# Patient Record
Sex: Female | Born: 1975
Health system: Southern US, Community
[De-identification: ages and names within clinical notes are randomized; demographics above are authoritative.]

## PROBLEM LIST (undated history)

## (undated) DIAGNOSIS — K589 Irritable bowel syndrome without diarrhea: Secondary | ICD-10-CM

## (undated) DIAGNOSIS — R51 Headache: Secondary | ICD-10-CM

## (undated) DIAGNOSIS — T7840XA Allergy, unspecified, initial encounter: Secondary | ICD-10-CM

## (undated) DIAGNOSIS — K2270 Barrett's esophagus without dysplasia: Secondary | ICD-10-CM

## (undated) DIAGNOSIS — E669 Obesity, unspecified: Secondary | ICD-10-CM

## (undated) DIAGNOSIS — F32A Depression, unspecified: Secondary | ICD-10-CM

## (undated) DIAGNOSIS — G8929 Other chronic pain: Secondary | ICD-10-CM

## (undated) DIAGNOSIS — K219 Gastro-esophageal reflux disease without esophagitis: Secondary | ICD-10-CM

## (undated) DIAGNOSIS — F419 Anxiety disorder, unspecified: Secondary | ICD-10-CM

## (undated) DIAGNOSIS — R519 Headache, unspecified: Secondary | ICD-10-CM

## (undated) DIAGNOSIS — F329 Major depressive disorder, single episode, unspecified: Secondary | ICD-10-CM

## (undated) DIAGNOSIS — M1990 Unspecified osteoarthritis, unspecified site: Secondary | ICD-10-CM

## (undated) DIAGNOSIS — I10 Essential (primary) hypertension: Secondary | ICD-10-CM

## (undated) HISTORY — DX: Anxiety disorder, unspecified: F41.9

## (undated) HISTORY — DX: Unspecified osteoarthritis, unspecified site: M19.90

## (undated) HISTORY — PX: COLONOSCOPY: SHX174

## (undated) HISTORY — DX: Gastro-esophageal reflux disease without esophagitis: K21.9

## (undated) HISTORY — DX: Irritable bowel syndrome, unspecified: K58.9

## (undated) HISTORY — DX: Major depressive disorder, single episode, unspecified: F32.9

## (undated) HISTORY — DX: Obesity, unspecified: E66.9

## (undated) HISTORY — DX: Headache, unspecified: R51.9

## (undated) HISTORY — DX: Depression, unspecified: F32.A

## (undated) HISTORY — DX: Other chronic pain: G89.29

## (undated) HISTORY — PX: REDUCTION MAMMAPLASTY: SUR839

## (undated) HISTORY — PX: OTHER SURGICAL HISTORY: SHX169

## (undated) HISTORY — DX: Headache: R51

## (undated) HISTORY — PX: CHOLECYSTECTOMY: SHX55

## (undated) HISTORY — DX: Allergy, unspecified, initial encounter: T78.40XA

## (undated) HISTORY — DX: Essential (primary) hypertension: I10

## (undated) HISTORY — DX: Barrett's esophagus without dysplasia: K22.70

---

## 2000-07-27 DIAGNOSIS — J3089 Other allergic rhinitis: Secondary | ICD-10-CM | POA: Insufficient documentation

## 2002-02-15 ENCOUNTER — Emergency Department (HOSPITAL_COMMUNITY): Admission: EM | Admit: 2002-02-15 | Discharge: 2002-02-15 | Payer: Self-pay | Admitting: Emergency Medicine

## 2006-08-31 ENCOUNTER — Encounter: Admission: RE | Admit: 2006-08-31 | Discharge: 2006-08-31 | Payer: Self-pay | Admitting: Unknown Physician Specialty

## 2008-03-13 DIAGNOSIS — F32A Depression, unspecified: Secondary | ICD-10-CM | POA: Insufficient documentation

## 2010-06-05 DIAGNOSIS — Z9049 Acquired absence of other specified parts of digestive tract: Secondary | ICD-10-CM | POA: Insufficient documentation

## 2010-06-10 DIAGNOSIS — R519 Headache, unspecified: Secondary | ICD-10-CM | POA: Insufficient documentation

## 2010-06-10 DIAGNOSIS — G4700 Insomnia, unspecified: Secondary | ICD-10-CM | POA: Insufficient documentation

## 2010-06-10 DIAGNOSIS — M2550 Pain in unspecified joint: Secondary | ICD-10-CM | POA: Insufficient documentation

## 2012-06-28 DIAGNOSIS — K589 Irritable bowel syndrome without diarrhea: Secondary | ICD-10-CM | POA: Insufficient documentation

## 2012-11-19 DIAGNOSIS — I10 Essential (primary) hypertension: Secondary | ICD-10-CM | POA: Insufficient documentation

## 2013-12-23 ENCOUNTER — Encounter: Payer: Self-pay | Admitting: Gastroenterology

## 2014-02-17 ENCOUNTER — Ambulatory Visit (INDEPENDENT_AMBULATORY_CARE_PROVIDER_SITE_OTHER): Payer: BC Managed Care – PPO | Admitting: Gastroenterology

## 2014-02-17 ENCOUNTER — Encounter: Payer: Self-pay | Admitting: Gastroenterology

## 2014-02-17 ENCOUNTER — Other Ambulatory Visit (INDEPENDENT_AMBULATORY_CARE_PROVIDER_SITE_OTHER): Payer: BC Managed Care – PPO

## 2014-02-17 VITALS — BP 124/74 | HR 64 | Ht 68.0 in | Wt 228.2 lb

## 2014-02-17 DIAGNOSIS — R079 Chest pain, unspecified: Secondary | ICD-10-CM

## 2014-02-17 DIAGNOSIS — R198 Other specified symptoms and signs involving the digestive system and abdomen: Secondary | ICD-10-CM

## 2014-02-17 DIAGNOSIS — K219 Gastro-esophageal reflux disease without esophagitis: Secondary | ICD-10-CM

## 2014-02-17 DIAGNOSIS — R109 Unspecified abdominal pain: Secondary | ICD-10-CM

## 2014-02-17 LAB — CBC WITH DIFFERENTIAL/PLATELET
Basophils Absolute: 0 10*3/uL (ref 0.0–0.1)
Basophils Relative: 0.6 % (ref 0.0–3.0)
EOS ABS: 0.5 10*3/uL (ref 0.0–0.7)
EOS PCT: 7.3 % — AB (ref 0.0–5.0)
HEMATOCRIT: 40.2 % (ref 36.0–46.0)
Hemoglobin: 13.6 g/dL (ref 12.0–15.0)
LYMPHS ABS: 1.7 10*3/uL (ref 0.7–4.0)
Lymphocytes Relative: 26.7 % (ref 12.0–46.0)
MCHC: 33.7 g/dL (ref 30.0–36.0)
MCV: 88.7 fl (ref 78.0–100.0)
MONO ABS: 0.4 10*3/uL (ref 0.1–1.0)
Monocytes Relative: 6.7 % (ref 3.0–12.0)
Neutro Abs: 3.7 10*3/uL (ref 1.4–7.7)
Neutrophils Relative %: 58.7 % (ref 43.0–77.0)
PLATELETS: 167 10*3/uL (ref 150.0–400.0)
RBC: 4.53 Mil/uL (ref 3.87–5.11)
RDW: 13.2 % (ref 11.5–15.5)
WBC: 6.4 10*3/uL (ref 4.0–10.5)

## 2014-02-17 LAB — HEPATIC FUNCTION PANEL
ALK PHOS: 41 U/L (ref 39–117)
ALT: 12 U/L (ref 0–35)
AST: 15 U/L (ref 0–37)
Albumin: 4 g/dL (ref 3.5–5.2)
BILIRUBIN DIRECT: 0.1 mg/dL (ref 0.0–0.3)
BILIRUBIN TOTAL: 0.8 mg/dL (ref 0.2–1.2)
Total Protein: 7 g/dL (ref 6.0–8.3)

## 2014-02-17 LAB — BASIC METABOLIC PANEL
BUN: 14 mg/dL (ref 6–23)
CHLORIDE: 106 meq/L (ref 96–112)
CO2: 27 meq/L (ref 19–32)
CREATININE: 0.9 mg/dL (ref 0.4–1.2)
Calcium: 9.4 mg/dL (ref 8.4–10.5)
GFR: 72.87 mL/min (ref >60.00)
GLUCOSE: 86 mg/dL (ref 70–99)
POTASSIUM: 4.3 meq/L (ref 3.5–5.1)
Sodium: 139 mEq/L (ref 135–145)

## 2014-02-17 LAB — TSH: TSH: 1.16 u[IU]/mL (ref 0.35–4.50)

## 2014-02-17 MED ORDER — DEXLANSOPRAZOLE 60 MG PO CPDR: 60.0000 mg | DELAYED_RELEASE_CAPSULE | Freq: Every day | ORAL | Status: DC

## 2014-02-17 MED ORDER — GLYCOPYRROLATE 1 MG PO TABS: 1.0000 mg | ORAL_TABLET | Freq: Two times a day (BID) | ORAL | Status: DC

## 2014-02-17 NOTE — Patient Instructions (Signed)
Your physician has requested that you go to the basement for the following lab work before leaving today: [employer].  You have been scheduled for an endoscopy with propofol. Please follow written instructions given to you at your visit today. If you use inhalers (even only as needed), please bring them with you on the day of your procedure. Your physician has requested that you go to www.startemmi.com and enter the access code given to you at your visit today. This web site gives a general overview about your procedure. However, you should still follow specific instructions given to you by our office regarding your preparation for the procedure.  We have sent the following medications to your pharmacy for you to pick up at your convenience: Robinul.  Hold Protonix and start Dexilant samples one tablet my mouth daily.   Patient advised to avoid spicy, acidic, citrus, chocolate, mints, fruit and fruit juices.  Limit the intake of caffeine, alcohol and Soda.  Don't exercise too soon after eating.  Don't lie down within 3-4 hours of eating.  Elevate the head of your bed.  Start over the counter Miralax mixing 17 grams in 8 oz of water daily.   Follow the instructions on the Hemoccult cards and mail them back to [place] when you are finished or you may take them directly to the lab in the basement of the [place] building. We will call you with the results.   Thank you for choosing me and [facility].  Venita Lick. Pleas Koch., MD., Clementeen Graham  cc: Kathlee Nations, MD

## 2014-02-17 NOTE — Progress Notes (Signed)
    History of Present Illness: This is a 38 year old female who relates a 15 year history of GERD. She started taking pantoprazole 40 mg daily about 2 years ago with good control of her reflux symptoms. In addition she has a history of irritable bowel syndrome with alternating diarrhea and constipation. She relates episodes of lower abdominal crampy pain. She states dicyclomine was tried with no impact on symptoms. She takes MiraLax occasionally when constipated but this often leads to diarrhea phase. She relates episodes of severe pressure-like chest pain for the past few months pantoprazole was increased to 40 mg twice daily along with ranitidine 150 mg twice daily with no apparent impact on her chest pain or lower abdominal pain. Denies weight loss, change in stool caliber, melena, hematochezia, nausea, vomiting, dysphagia.  Review of Systems: Pertinent positive and negative review of systems were noted in the above HPI section. All other review of systems were otherwise negative.  Current Medications, Allergies, Past Medical History, Past Surgical History, Family History and Social History were reviewed in [employer] record.  Physical Exam: General: Well developed , well nourished, no acute distress Head: Normocephalic and atraumatic Eyes:  sclerae anicteric, EOMI Ears: Normal auditory acuity Mouth: No deformity or lesions Neck: Supple, no masses or thyromegaly Lungs: Clear throughout to auscultation Heart: Regular rate and rhythm; no murmurs, rubs or bruits Abdomen: Soft, non tender and non distended. No masses, hepatosplenomegaly or hernias noted. Normal Bowel sounds Musculoskeletal: Symmetrical with no gross deformities  Skin: No lesions on visible extremities Pulses:  Normal pulses noted Extremities: No clubbing, cyanosis, edema or deformities noted Neurological: Alert oriented x 4, grossly nonfocal Cervical Nodes:  No significant cervical adenopathy Inguinal  Nodes: No significant inguinal adenopathy Psychological:  Alert and cooperative. Normal mood and affect  Assessment and Recommendations:  1. Presumed IBS. Glycopyrrolate 1 mg bid. Miralax once daily or qod to prevent constipation and to maintain regular daily bowel movements. Blood work. Stool hemoccults.  2. Chest pain, intermittent. This symptom does not appear to be typical for GERD and has not responded to acid suppression. Await LFTs however her symptoms do not appear typical for choledocholithiasis. Further evaluation with EGD as below. Her primary physician will need to evaluate for other causes  3. GERD. Dexilant 60 mg po daily. The risks, benefits, and alternatives to endoscopy with possible biopsy and possible dilation were discussed with the patient and they consent to proceed.

## 2014-02-18 ENCOUNTER — Other Ambulatory Visit: Payer: Self-pay

## 2014-02-18 DIAGNOSIS — D649 Anemia, unspecified: Secondary | ICD-10-CM

## 2014-02-24 ENCOUNTER — Encounter: Payer: Self-pay | Admitting: Gastroenterology

## 2014-02-24 ENCOUNTER — Ambulatory Visit (AMBULATORY_SURGERY_CENTER): Payer: BC Managed Care – PPO | Admitting: Gastroenterology

## 2014-02-24 VITALS — BP 120/87 | HR 53 | Temp 97.1°F | Resp 18 | Ht 68.0 in | Wt 228.0 lb

## 2014-02-24 DIAGNOSIS — K219 Gastro-esophageal reflux disease without esophagitis: Secondary | ICD-10-CM

## 2014-02-24 DIAGNOSIS — R079 Chest pain, unspecified: Secondary | ICD-10-CM

## 2014-02-24 HISTORY — PX: UPPER GASTROINTESTINAL ENDOSCOPY: SHX188

## 2014-02-24 MED ORDER — DEXLANSOPRAZOLE 60 MG PO CPDR: 60.0000 mg | DELAYED_RELEASE_CAPSULE | Freq: Every day | ORAL | Status: DC

## 2014-02-24 MED ORDER — SODIUM CHLORIDE 0.9 % IV SOLN: 500.0000 mL | INTRAVENOUS | Status: DC

## 2014-02-24 NOTE — Progress Notes (Signed)
A/ox3, pleased with MAC, report to RN 

## 2014-02-24 NOTE — Patient Instructions (Signed)
YOU HAD AN ENDOSCOPIC PROCEDURE TODAY AT THE [facility]: Refer to the procedure report that was given to you for any specific questions about what was found during the examination.  If the procedure report does not answer your questions, please call your gastroenterologist to clarify.  If you requested that your care partner not be given the details of your procedure findings, then the procedure report has been included in a sealed envelope for you to review at your convenience later.  YOU SHOULD EXPECT: Some feelings of bloating in the abdomen. Passage of more gas than usual.  Walking can help get rid of the air that was put into your GI tract during the procedure and reduce the bloating. If you had a lower endoscopy (such as a colonoscopy or flexible sigmoidoscopy) you may notice spotting of blood in your stool or on the toilet paper. If you underwent a bowel prep for your procedure, then you may not have a normal bowel movement for a few days.  DIET: Your first meal following the procedure should be a light meal and then it is ok to progress to your normal diet.  A half-sandwich or bowl of soup is an example of a good first meal.  Heavy or fried foods are harder to digest and may make you feel nauseous or bloated.  Likewise meals heavy in dairy and vegetables can cause extra gas to form and this can also increase the bloating.  Drink plenty of fluids but you should avoid alcoholic beverages for 24 hours.  ACTIVITY: Your care partner should take you home directly after the procedure.  You should plan to take it easy, moving slowly for the rest of the day.  You can resume normal activity the day after the procedure however you should NOT DRIVE or use heavy machinery for 24 hours (because of the sedation medicines used during the test).    SYMPTOMS TO REPORT IMMEDIATELY: A gastroenterologist can be reached at any hour.  During normal business hours, 8:30 AM to 5:00 PM Monday through Friday,  call [phone].  After hours and on weekends, please call the GI answering service at [phone] who will take a message and have the physician on call contact you.    Following upper endoscopy (EGD)  Vomiting of blood or coffee ground material  New chest pain or pain under the shoulder blades  Painful or persistently difficult swallowing  New shortness of breath  Fever of 100F or higher  Black, tarry-looking stools  FOLLOW UP: If any biopsies were taken you will be contacted by phone or by letter within the next 1-3 weeks.  Call your gastroenterologist if you have not heard about the biopsies in 3 weeks.  Our staff will call the home number listed on your records the next business day following your procedure to check on you and address any questions or concerns that you may have at that time regarding the information given to you following your procedure. This is a courtesy call and so if there is no answer at the home number and we have not heard from you through the emergency physician on call, we will assume that you have returned to your regular daily activities without incident.  SIGNATURES/CONFIDENTIALITY: You and/or your care partner have signed paperwork which will be entered into your electronic medical record.  These signatures attest to the fact that that the information above on your After Visit Summary has been reviewed and is understood.  Full   responsibility of the confidentiality of this discharge information lies with you and/or your care-partner.  Resume medications. Information given on Barrett's and hiatal hernia with discharge instructions.

## 2014-02-24 NOTE — Op Note (Signed)
[facility] [address] [place], [address]   ENDOSCOPY PROCEDURE REPORT  PATIENT: Sara Chambers, Sara Chambers  MR#: [id number] BIRTHDATE: [birth year] , 37  yrs. old GENDER: Female ENDOSCOPIST: Meryl Dare, MD, Clementeen Graham REFERRED BY:  Kathlee Nations, M.D. PROCEDURE DATE:  02/24/2014 PROCEDURE:  EGD w/ biopsy ASA CLASS:     Class II INDICATIONS:  History of esophageal reflux.   Chest pain. MEDICATIONS: MAC sedation, administered by CRNA and propofol (Diprivan) 300mg  IV TOPICAL ANESTHETIC: none DESCRIPTION OF PROCEDURE: After the risks benefits and alternatives of the procedure were thoroughly explained, informed consent was obtained.  The LB [id number] endoscope was introduced through the mouth and advanced to the second portion of the duodenum. Without limitations.  The instrument was slowly withdrawn as the mucosa was fully examined.  ESOPHAGUS: There was evidence of suspected Barrett's esophagus in the lower third of the esophagus and middle third of the esophagus from 29-35 cm from the incisors.  Multiple random biopsies were performed.   The esophagus was otherwise normal. STOMACH: The mucosa and folds of the stomach appeared normal. DUODENUM: The duodenal mucosa showed no abnormalities in the bulb and second portion of the duodenum.  Retroflexed views revealed a 5 cm hiatal hernia.  The scope was then withdrawn from the patient and the procedure completed.  COMPLICATIONS: There were no complications.  ENDOSCOPIC IMPRESSION: 1.   Suspected Barrett's esophagus; multiple random biopsies 2.   5 cm hiatal hernia  RECOMMENDATIONS: 1.  Anti-reflux regimen long term 2.  Await pathology results 3.  Continue Dexliant 60 qam and ranitidine 300 mg hs 4.  Although she has GERD I do not feel that GERD explains all her chest symptoms  eSigned:  Meryl Dare, MD, [facility] 02/24/2014 2:30 PM

## 2014-02-25 ENCOUNTER — Telehealth: Payer: Self-pay | Admitting: *Deleted

## 2014-02-25 NOTE — Telephone Encounter (Signed)
  Follow up Call-  Call back number 02/24/2014  Post procedure Call Back phone  # [phone]  Permission to leave phone message Yes     Patient questions:  Do you have a fever, pain , or abdominal swelling? no Pain Score  0 *  Have you tolerated food without any problems? yes  Have you been able to return to your normal activities? yes  Do you have any questions about your discharge instructions: Diet   no Medications  no Follow up visit  no  Do you have questions or concerns about your Care? no  Actions: * If pain score is 4 or above: No action needed, pain <4.

## 2014-02-27 ENCOUNTER — Other Ambulatory Visit (INDEPENDENT_AMBULATORY_CARE_PROVIDER_SITE_OTHER): Payer: BC Managed Care – PPO

## 2014-02-27 DIAGNOSIS — R198 Other specified symptoms and signs involving the digestive system and abdomen: Secondary | ICD-10-CM

## 2014-02-27 DIAGNOSIS — K2270 Barrett's esophagus without dysplasia: Secondary | ICD-10-CM

## 2014-02-27 LAB — HEMOCCULT SLIDES (X 3 CARDS)
Fecal Occult Blood: NEGATIVE
OCCULT 1: POSITIVE — AB
OCCULT 2: NEGATIVE
OCCULT 3: NEGATIVE
OCCULT 4: NEGATIVE
OCCULT 5: NEGATIVE

## 2014-03-03 ENCOUNTER — Encounter: Payer: Self-pay | Admitting: Gastroenterology

## 2014-03-03 ENCOUNTER — Telehealth: Payer: Self-pay | Admitting: Gastroenterology

## 2014-03-03 DIAGNOSIS — R195 Other fecal abnormalities: Secondary | ICD-10-CM

## 2014-03-03 NOTE — Telephone Encounter (Signed)
She can repeat but even if negative I would still recommend colonoscopy since they were positive. If there are negative on repeat and she wants to delay colonoscopy I understand.

## 2014-03-03 NOTE — Telephone Encounter (Signed)
Left message for patient to call back  

## 2014-03-03 NOTE — Telephone Encounter (Signed)
Dr. Russella Dar patient would like to repeat hemoccult cards prior to scheduling a colonoscopy.  See result note on hemoccult cards from 02/27/14.  Is this ok?

## 2014-03-03 NOTE — Telephone Encounter (Signed)
Patient notified of Dr. Ardell Isaacs recommendations.  I have mailed her hemoccult cards.  She wants to think about it and call back

## 2014-03-09 DIAGNOSIS — K2270 Barrett's esophagus without dysplasia: Secondary | ICD-10-CM | POA: Insufficient documentation

## 2014-04-14 ENCOUNTER — Encounter: Payer: Self-pay | Admitting: Gastroenterology

## 2014-05-29 ENCOUNTER — Encounter: Payer: BC Managed Care – PPO | Admitting: Gastroenterology

## 2014-06-29 ENCOUNTER — Other Ambulatory Visit: Payer: Self-pay

## 2014-06-29 DIAGNOSIS — Z1231 Encounter for screening mammogram for malignant neoplasm of breast: Secondary | ICD-10-CM

## 2014-06-29 DIAGNOSIS — Z9889 Other specified postprocedural states: Secondary | ICD-10-CM

## 2014-07-07 ENCOUNTER — Encounter (INDEPENDENT_AMBULATORY_CARE_PROVIDER_SITE_OTHER): Payer: Self-pay

## 2014-07-07 ENCOUNTER — Ambulatory Visit
Admission: RE | Admit: 2014-07-07 | Discharge: 2014-07-07 | Disposition: A | Discharge status: Home / Self Care | Payer: BC Managed Care – PPO | Source: Ambulatory Visit

## 2014-07-07 DIAGNOSIS — Z9889 Other specified postprocedural states: Secondary | ICD-10-CM

## 2014-07-07 DIAGNOSIS — Z1231 Encounter for screening mammogram for malignant neoplasm of breast: Secondary | ICD-10-CM

## 2014-07-10 ENCOUNTER — Other Ambulatory Visit: Payer: Self-pay | Admitting: Internal Medicine

## 2014-07-10 DIAGNOSIS — R928 Other abnormal and inconclusive findings on diagnostic imaging of breast: Secondary | ICD-10-CM

## 2014-07-23 ENCOUNTER — Other Ambulatory Visit: Payer: Self-pay | Admitting: Internal Medicine

## 2014-07-23 ENCOUNTER — Ambulatory Visit
Admission: RE | Admit: 2014-07-23 | Discharge: 2014-07-23 | Disposition: A | Discharge status: Home / Self Care | Payer: BC Managed Care – PPO | Source: Ambulatory Visit | Attending: Internal Medicine | Admitting: Internal Medicine

## 2014-07-23 DIAGNOSIS — R928 Other abnormal and inconclusive findings on diagnostic imaging of breast: Secondary | ICD-10-CM

## 2014-08-07 ENCOUNTER — Ambulatory Visit
Admission: RE | Admit: 2014-08-07 | Discharge: 2014-08-07 | Disposition: A | Discharge status: Home / Self Care | Payer: BC Managed Care – PPO | Source: Ambulatory Visit | Attending: Internal Medicine | Admitting: Internal Medicine

## 2014-08-07 DIAGNOSIS — R928 Other abnormal and inconclusive findings on diagnostic imaging of breast: Secondary | ICD-10-CM

## 2014-09-01 DIAGNOSIS — M1710 Unilateral primary osteoarthritis, unspecified knee: Secondary | ICD-10-CM | POA: Insufficient documentation

## 2014-09-01 DIAGNOSIS — M170 Bilateral primary osteoarthritis of knee: Secondary | ICD-10-CM | POA: Insufficient documentation

## 2014-09-01 DIAGNOSIS — M179 Osteoarthritis of knee, unspecified: Secondary | ICD-10-CM | POA: Insufficient documentation

## 2014-09-07 ENCOUNTER — Other Ambulatory Visit: Payer: Self-pay | Admitting: Sports Medicine

## 2014-09-07 DIAGNOSIS — T148XXA Other injury of unspecified body region, initial encounter: Secondary | ICD-10-CM

## 2014-09-09 ENCOUNTER — Ambulatory Visit
Admission: RE | Admit: 2014-09-09 | Discharge: 2014-09-09 | Disposition: A | Discharge status: Home / Self Care | Payer: BC Managed Care – PPO | Source: Ambulatory Visit | Attending: Sports Medicine | Admitting: Sports Medicine

## 2014-09-09 DIAGNOSIS — T148XXA Other injury of unspecified body region, initial encounter: Secondary | ICD-10-CM

## 2015-01-01 ENCOUNTER — Other Ambulatory Visit: Payer: Self-pay | Admitting: Internal Medicine

## 2015-01-01 ENCOUNTER — Other Ambulatory Visit: Payer: Self-pay

## 2015-01-01 DIAGNOSIS — N6489 Other specified disorders of breast: Secondary | ICD-10-CM

## 2015-02-09 ENCOUNTER — Ambulatory Visit
Admission: RE | Admit: 2015-02-09 | Discharge: 2015-02-09 | Disposition: A | Discharge status: Home / Self Care | Payer: BLUE CROSS/BLUE SHIELD | Source: Ambulatory Visit

## 2015-02-09 ENCOUNTER — Encounter (INDEPENDENT_AMBULATORY_CARE_PROVIDER_SITE_OTHER): Payer: Self-pay

## 2015-02-09 DIAGNOSIS — N6489 Other specified disorders of breast: Secondary | ICD-10-CM

## 2015-03-04 ENCOUNTER — Telehealth: Payer: Self-pay | Admitting: Gastroenterology

## 2015-03-04 NOTE — Telephone Encounter (Signed)
Yes ok for direct colonoscopy in [facility] 

## 2015-03-04 NOTE — Telephone Encounter (Signed)
Patient had a hemoccult card last year that was 1 out of 6 positive.  She declined to schedule the colonoscopy you recommended last year.  She is now calling and wants to schedule.  Is this ok to do direct?

## 2015-03-04 NOTE — Telephone Encounter (Signed)
Left message for patient to call back  

## 2015-03-04 NOTE — Telephone Encounter (Signed)
Patient left me a voicemail and I tried to return call again on both numbers at her request.   Left message for patient to call back

## 2015-03-05 NOTE — Telephone Encounter (Signed)
Patient is scheduled for colon and pre-visit

## 2015-04-19 ENCOUNTER — Ambulatory Visit (AMBULATORY_SURGERY_CENTER): Payer: Self-pay

## 2015-04-19 VITALS — Ht 68.0 in | Wt 229.0 lb

## 2015-04-19 DIAGNOSIS — R195 Other fecal abnormalities: Secondary | ICD-10-CM

## 2015-04-19 MED ORDER — NA SULFATE-K SULFATE-MG SULF 17.5-3.13-1.6 GM/177ML PO SOLN: 1.0000 | Freq: Once | ORAL | Status: DC

## 2015-04-19 NOTE — Progress Notes (Signed)
No anesthesia complications Not on home 02 No egg or soy allergies Pt doesn't take diet drugs Emmi video sent to [email or web address]

## 2015-04-27 ENCOUNTER — Encounter: Payer: BLUE CROSS/BLUE SHIELD | Admitting: Gastroenterology

## 2015-04-28 ENCOUNTER — Ambulatory Visit (HOSPITAL_COMMUNITY)
Admission: RE | Admit: 2015-04-28 | Discharge: 2015-04-28 | Disposition: A | Discharge status: Home / Self Care | Payer: BLUE CROSS/BLUE SHIELD | Source: Ambulatory Visit | Attending: Internal Medicine | Admitting: Internal Medicine

## 2015-04-28 ENCOUNTER — Telehealth: Payer: Self-pay | Admitting: Gastroenterology

## 2015-04-28 ENCOUNTER — Other Ambulatory Visit (HOSPITAL_COMMUNITY): Payer: Self-pay | Admitting: *Deleted

## 2015-04-28 DIAGNOSIS — R1011 Right upper quadrant pain: Secondary | ICD-10-CM

## 2015-04-28 DIAGNOSIS — Z9049 Acquired absence of other specified parts of digestive tract: Secondary | ICD-10-CM | POA: Insufficient documentation

## 2015-04-28 DIAGNOSIS — R1084 Generalized abdominal pain: Secondary | ICD-10-CM | POA: Insufficient documentation

## 2015-04-28 DIAGNOSIS — R112 Nausea with vomiting, unspecified: Secondary | ICD-10-CM | POA: Insufficient documentation

## 2015-04-28 MED ORDER — IOHEXOL 300 MG/ML  SOLN
100.0000 mL | Freq: Once | INTRAMUSCULAR | Status: AC | PRN
  Administered 2015-04-28: 100 mL via INTRAVENOUS

## 2015-04-28 NOTE — Telephone Encounter (Signed)
Patient is on the way to the ER in [place]

## 2015-04-29 ENCOUNTER — Ambulatory Visit (INDEPENDENT_AMBULATORY_CARE_PROVIDER_SITE_OTHER): Payer: BLUE CROSS/BLUE SHIELD | Admitting: Nurse Practitioner

## 2015-04-29 ENCOUNTER — Telehealth: Payer: Self-pay | Admitting: Gastroenterology

## 2015-04-29 ENCOUNTER — Encounter: Payer: Self-pay | Admitting: Nurse Practitioner

## 2015-04-29 VITALS — BP 96/60 | HR 76 | Ht 66.54 in | Wt 223.5 lb

## 2015-04-29 DIAGNOSIS — R1010 Upper abdominal pain, unspecified: Secondary | ICD-10-CM

## 2015-04-29 DIAGNOSIS — R1012 Left upper quadrant pain: Secondary | ICD-10-CM

## 2015-04-29 DIAGNOSIS — R9389 Abnormal findings on diagnostic imaging of other specified body structures: Secondary | ICD-10-CM | POA: Insufficient documentation

## 2015-04-29 DIAGNOSIS — R7989 Other specified abnormal findings of blood chemistry: Secondary | ICD-10-CM | POA: Diagnosis not present

## 2015-04-29 DIAGNOSIS — R938 Abnormal findings on diagnostic imaging of other specified body structures: Secondary | ICD-10-CM

## 2015-04-29 DIAGNOSIS — R933 Abnormal findings on diagnostic imaging of other parts of digestive tract: Secondary | ICD-10-CM

## 2015-04-29 DIAGNOSIS — G8929 Other chronic pain: Secondary | ICD-10-CM

## 2015-04-29 DIAGNOSIS — R945 Abnormal results of liver function studies: Principal | ICD-10-CM

## 2015-04-29 DIAGNOSIS — R1011 Right upper quadrant pain: Secondary | ICD-10-CM

## 2015-04-29 NOTE — Progress Notes (Signed)
History of Present Illness:  Patient is a 39 year old female known to Dr. Fuller Plan. She is actually scheduled for colonoscopy in September for evaluation of focal blood in stool.  Patient comes in today for evaluation of abdominal pain and abnormal CT scan. On Sunday, 5 days ago patient developed acute vomiting, upper abdominal pain and diarrhea. She took several times for the upper abdominal pain which felt like an acid reflux attack. She is on a PPI and an H2 blocker. The pain did not radiate through to the back but it did not get any better so patient saw PCP.  Alkaline phosphatase elevated at 132, AST 197, ALT 293, total bilirubin was normal. White count was normal. Patient went to the [facility] ED yesterday for persistent symptoms. Her LFTs had come down. Alk phosphatase was 116, AST 58, ALT 183. Bilirubin still normal. Her lipase was normal. White count was normal. Abdominal ultrasound showed a mildly dilated common bile duct. No mass or stones in the duct. CT scan with contrast was negative for ductal dilation however. No acute findings on the scan though the tip of the appendix was mildly dilated. No surrounding inflammatory changes but multiple borderline enlarged ileocecal lymph nodes were seen.  Patient is still nauseated, the diarrhea has resolved. Her upper abdominal pain has significantly in improved but now she is having discomfort in her right lower quadrant unrelated to eating.  Current Medications, Allergies, Past Medical History, Past Surgical History, Family History and Social History were reviewed in [employer] record.  Studies:   Ct Abdomen Pelvis W Contrast  04/28/2015   CLINICAL DATA:  39 year old female with generalized abdominal pain since Sunday. Nausea and vomiting.  EXAM: CT ABDOMEN AND PELVIS WITH CONTRAST  TECHNIQUE: Multidetector CT imaging of the abdomen and pelvis was performed using the standard protocol following bolus administration of  intravenous contrast.  CONTRAST:  159m OMNIPAQUE IOHEXOL 300 MG/ML  SOLN  COMPARISON:  No priors.  FINDINGS: Lower chest:  Unremarkable.  Hepatobiliary: No cystic or solid hepatic lesions. No intra or extrahepatic biliary ductal dilatation. Status post cholecystectomy.  Pancreas: No pancreatic mass. No pancreatic ductal dilatation. No pancreatic or peripancreatic fluid or inflammatory changes.  Spleen: Unremarkable.  Adrenals/Urinary Tract: Normal appearance of the kidneys and bilateral adrenal glands. No hydroureteronephrosis. Urinary bladder is normal in appearance.  Stomach/Bowel: The appearance of the stomach is normal. No pathologic dilatation of small bowel or colon. Although the proximal aspect of the appendix is normal in size in appearance, there is an appendicolith in the distal aspect of the appendix, and the tip of the appendix appears mildly dilated measuring up to 8 mm. No periappendiceal inflammatory changes are noted at this time.  Vascular/Lymphatic: No significant atherosclerotic disease, aneurysm or dissection identified in the abdominal or pelvic vasculature. Retroaortic left renal vein (normal anatomical variant) incidentally noted. Several borderline enlarged ileocecal lymph nodes are noted, measuring up to 9 mm in short axis. No other lymphadenopathy is noted elsewhere in the abdomen or pelvis.  Reproductive: Fallopian tube occluder devices are noted. Uterus and ovaries are otherwise unremarkable in appearance.  Other: No significant volume of ascites.  No pneumoperitoneum.  Musculoskeletal: There are no aggressive appearing lytic or blastic lesions noted in the visualized portions of the skeleton.  IMPRESSION: 1. While there are no acute findings in the abdomen or pelvis to account for the patient's symptoms, there is an appendicolith in the distal appendix, and the tip of the appendix is mildly dilated.  Although there are no surrounding inflammatory changes to suggest an acute appendicitis  at this time, these features can suggest chronic appendicitis, particularly with the multiple borderline enlarged ileocecal lymph nodes. Clinical correlation is recommended. 2. No other acute findings to account for the patient's symptoms. 3. Status post cholecystectomy. 4. Additional incidental findings, as above. These results will be called to the ordering clinician or representative by the Radiologist Assistant, and communication documented in the PACS or zVision Dashboard.   Electronically Signed   By: Vinnie Langton M.D.   On: 04/28/2015 17:59     Physical Exam: General: Pleasant, well developed , white female in no acute distress Head: Normocephalic and atraumatic Eyes:  sclerae anicteric, conjunctiva pink  Ears: Normal auditory acuity Lungs: Clear throughout to auscultation Heart: Regular rate and rhythm Abdomen: Soft, non distended, mild RLQ tenderness.  No masses, no hepatomegaly. Normal bowel sounds Musculoskeletal: Symmetrical with no gross deformities  Extremities: No edema  Neurological: Alert oriented x 4, grossly nonfocal Psychological:  Alert and cooperative. Normal mood and affect  Assessment and Recommendations:  Pleasant 39 year old female with recent episode of nausea, vomiting, diarrhea and upper abdominal pain. Symptoms associated with some LFT abnormalities, mainly transaminitis. Mild biliary duct dilation on ultrasound but not on CT scan. Patient is status post remote cholecystectomy for gallstones. Diarrhea has resolved. Patient still with some residual upper abdominal discomfort but pain has now moved down into her RLQ.  Overall picture confusing with both upper and lower abdominal symptoms. She may have passed a gallstone based on symptoms, abnormal LFTs, biliary duct dilation.   Will proceed with MRCP for further evaluation of the biliary tree.  Her LFTs were coming down at last check, will recheck those again in one week.   Not sure about clinical  significance of appendix findings on CTscan. Her white count is normal, not significantly tender in the right lower quadrant. Patient may need a surgical evaluation at some point. She will call if pain does not resolve, or certainly if it escalates. She is already scheduled for a colonoscopy for evaluation of blood in stool.

## 2015-04-29 NOTE — Telephone Encounter (Signed)
See EGD report. Take protonix 40 mg bid ac and randitidine 300 mg hs. Follow all antireflux measures.  Needs APP visit. Might need MRCP. R/O CBD stones.  Not sure what to make of appendix CT findings. Might need surgical opinion.

## 2015-04-29 NOTE — Telephone Encounter (Signed)
Patient reports that she has had GERD, vomiting, and chest pressure since Saturday.  She is is now having RLQ pain and nausea.  She has phenergan at home this has stopped the vomiting, but she is still nauseated.  She is taking protonix BID, zantac BID, and carafate was added on Tuesday by her primary care.  She had elevated LFTs at her primary care on Tuesday, but reports that she was told at [facility] ER yesterday.  I have contacted [facility] for the labs and notes,.  I will have you review when they fax.  You have the labs from her primary care in the pack as well as the CT from yesterday.

## 2015-04-29 NOTE — Patient Instructions (Signed)
You have been scheduled for an MRI on 05-03-2015 on Monday. Your appointment time is  9:00 am. Please arrive at 8:45 am prior to your appointment time for registration purposes. Please make certain not to have anything to eat or drink after midnight . In addition, if you have any metal in your body, have a pacemaker or defibrillator, please be sure to let your ordering physician know. This test typically takes 45 minutes to 1 hour to complete.  We will call you with the results.

## 2015-04-29 NOTE — Telephone Encounter (Signed)
Patient notified to come in today and see Willette Cluster RNP at 1:30

## 2015-04-30 NOTE — Progress Notes (Signed)
Reviewed and agree with management plan.  Vontrell Pullman T. Coy Rochford, MD FACG 

## 2015-05-03 ENCOUNTER — Ambulatory Visit (HOSPITAL_COMMUNITY)
Admission: RE | Admit: 2015-05-03 | Discharge: 2015-05-03 | Disposition: A | Discharge status: Home / Self Care | Payer: BLUE CROSS/BLUE SHIELD | Source: Ambulatory Visit | Attending: Nurse Practitioner | Admitting: Nurse Practitioner

## 2015-05-03 ENCOUNTER — Other Ambulatory Visit: Payer: Self-pay

## 2015-05-03 ENCOUNTER — Other Ambulatory Visit: Payer: Self-pay | Admitting: Nurse Practitioner

## 2015-05-03 ENCOUNTER — Other Ambulatory Visit (INDEPENDENT_AMBULATORY_CARE_PROVIDER_SITE_OTHER): Payer: BLUE CROSS/BLUE SHIELD

## 2015-05-03 DIAGNOSIS — R1012 Left upper quadrant pain: Secondary | ICD-10-CM

## 2015-05-03 DIAGNOSIS — R0789 Other chest pain: Secondary | ICD-10-CM | POA: Insufficient documentation

## 2015-05-03 DIAGNOSIS — R1010 Upper abdominal pain, unspecified: Secondary | ICD-10-CM | POA: Diagnosis not present

## 2015-05-03 DIAGNOSIS — G8929 Other chronic pain: Secondary | ICD-10-CM

## 2015-05-03 DIAGNOSIS — Z9049 Acquired absence of other specified parts of digestive tract: Secondary | ICD-10-CM | POA: Insufficient documentation

## 2015-05-03 DIAGNOSIS — R7989 Other specified abnormal findings of blood chemistry: Secondary | ICD-10-CM

## 2015-05-03 DIAGNOSIS — R945 Abnormal results of liver function studies: Principal | ICD-10-CM

## 2015-05-03 DIAGNOSIS — R9389 Abnormal findings on diagnostic imaging of other specified body structures: Secondary | ICD-10-CM

## 2015-05-03 DIAGNOSIS — R1011 Right upper quadrant pain: Secondary | ICD-10-CM

## 2015-05-03 LAB — HEPATIC FUNCTION PANEL
ALBUMIN: 4.1 g/dL (ref 3.5–5.2)
ALT: 38 U/L — ABNORMAL HIGH (ref 0–35)
AST: 13 U/L (ref 0–37)
Alkaline Phosphatase: 68 U/L (ref 39–117)
Bilirubin, Direct: 0 mg/dL (ref 0.0–0.3)
TOTAL PROTEIN: 6.9 g/dL (ref 6.0–8.3)
Total Bilirubin: 0.3 mg/dL (ref 0.2–1.2)

## 2015-05-03 LAB — POCT I-STAT CREATININE: CREATININE: 1 mg/dL (ref 0.44–1.00)

## 2015-05-18 ENCOUNTER — Other Ambulatory Visit: Payer: Self-pay | Admitting: Obstetrics & Gynecology

## 2015-05-19 LAB — CYTOLOGY - PAP

## 2015-06-01 DIAGNOSIS — F900 Attention-deficit hyperactivity disorder, predominantly inattentive type: Secondary | ICD-10-CM | POA: Insufficient documentation

## 2015-06-03 ENCOUNTER — Telehealth: Payer: Self-pay | Admitting: Gastroenterology

## 2015-06-03 NOTE — Telephone Encounter (Signed)
Reviewed updated prep instructions with patient

## 2015-06-07 ENCOUNTER — Encounter: Payer: Self-pay | Admitting: Gastroenterology

## 2015-06-07 ENCOUNTER — Ambulatory Visit (AMBULATORY_SURGERY_CENTER): Payer: BLUE CROSS/BLUE SHIELD | Admitting: Gastroenterology

## 2015-06-07 VITALS — BP 127/71 | HR 72 | Temp 98.5°F | Resp 24 | Ht 68.0 in | Wt 229.0 lb

## 2015-06-07 DIAGNOSIS — R195 Other fecal abnormalities: Secondary | ICD-10-CM | POA: Diagnosis present

## 2015-06-07 MED ORDER — SODIUM CHLORIDE 0.9 % IV SOLN: 500.0000 mL | INTRAVENOUS | Status: DC

## 2015-06-07 NOTE — Op Note (Signed)
[facility] [address] [place], [address]   COLONOSCOPY PROCEDURE REPORT  PATIENT: Sara Chambers, Sara Chambers  MR#: [id number] BIRTHDATE: 25-Jul-1976 , 39  yrs. old GENDER: female ENDOSCOPIST: Meryl Dare, MD, [facility] REFERRED BY: Kathlee Nations, MD PROCEDURE DATE:  06/07/2015 PROCEDURE:   Colonoscopy, diagnostic First Screening Colonoscopy - Avg.  risk and is 50 yrs.  old or older - No.  Prior Negative Screening - Now for repeat screening. N/A  History of Adenoma - Now for follow-up colonoscopy & has been > or = to 3 yrs.  N/A  Polyps removed today? No Recommend repeat exam, <10 yrs? No ASA CLASS:   Class II INDICATIONS:Evaluation of unexplained GI bleeding, Colorectal Neoplasm Risk Assessment for this procedure is average risk, and heme-positive stool. MEDICATIONS: Monitored anesthesia care and Propofol 300 mg IV DESCRIPTION OF PROCEDURE:   After the risks benefits and alternatives of the procedure were thoroughly explained, informed consent was obtained.  The digital rectal exam revealed no abnormalities of the rectum.   The LB [id number]  endoscope was introduced through the anus and advanced to the cecum, which was identified by both the appendix and ileocecal valve. No adverse events experienced.   The quality of the prep was excellent. (Suprep was used)  The instrument was then slowly withdrawn as the colon was fully examined. Estimated blood loss is zero unless otherwise noted in this procedure report.    COLON FINDINGS: A normal appearing cecum, ileocecal valve, and appendiceal orifice were identified.  The ascending, transverse, descending, sigmoid colon, and rectum appeared unremarkable. Retroflexed views revealed no abnormalities. The time to cecum = 2.5 Withdrawal time = 9.1   The scope was withdrawn and the procedure completed. COMPLICATIONS: There were no immediate complications.  ENDOSCOPIC IMPRESSION: Normal  colonoscopy  RECOMMENDATIONS: Continue current colorectal screening recommendations for "routine risk" patients with a repeat colonoscopy in 10 years.  eSigned:  Meryl Dare, MD, [facility] 06/07/2015 2:02 PM

## 2015-06-07 NOTE — Patient Instructions (Signed)
YOU HAD AN ENDOSCOPIC PROCEDURE TODAY AT THE [facility]:   Refer to the procedure report that was given to you for any specific questions about what was found during the examination.  If the procedure report does not answer your questions, please call your gastroenterologist to clarify.  If you requested that your care partner not be given the details of your procedure findings, then the procedure report has been included in a sealed envelope for you to review at your convenience later.  YOU SHOULD EXPECT: Some feelings of bloating in the abdomen. Passage of more gas than usual.  Walking can help get rid of the air that was put into your GI tract during the procedure and reduce the bloating. If you had a lower endoscopy (such as a colonoscopy or flexible sigmoidoscopy) you may notice spotting of blood in your stool or on the toilet paper. If you underwent a bowel prep for your procedure, you may not have a normal bowel movement for a few days.  Please Note:  You might notice some irritation and congestion in your nose or some drainage.  This is from the oxygen used during your procedure.  There is no need for concern and it should clear up in a day or so.  SYMPTOMS TO REPORT IMMEDIATELY:   Following lower endoscopy (colonoscopy or flexible sigmoidoscopy):  Excessive amounts of blood in the stool  Significant tenderness or worsening of abdominal pains  Swelling of the abdomen that is new, acute  Fever of 100F or higher   For urgent or emergent issues, a gastroenterologist can be reached at any hour by calling [phone].   DIET: Your first meal following the procedure should be a small meal and then it is ok to progress to your normal diet. Heavy or fried foods are harder to digest and may make you feel nauseous or bloated.  Likewise, meals heavy in dairy and vegetables can increase bloating.  Drink plenty of fluids but you should avoid alcoholic beverages for 24  hours.  ACTIVITY:  You should plan to take it easy for the rest of today and you should NOT DRIVE or use heavy machinery until tomorrow (because of the sedation medicines used during the test).    FOLLOW UP: Our staff will call the number listed on your records the next business day following your procedure to check on you and address any questions or concerns that you may have regarding the information given to you following your procedure. If we do not reach you, we will leave a message.  However, if you are feeling well and you are not experiencing any problems, there is no need to return our call.  We will assume that you have returned to your regular daily activities without incident.  If any biopsies were taken you will be contacted by phone or by letter within the next 1-3 weeks.  Please call us at [phone] if you have not heard about the biopsies in 3 weeks.    SIGNATURES/CONFIDENTIALITY: You and/or your care partner have signed paperwork which will be entered into your electronic medical record.  These signatures attest to the fact that that the information above on your After Visit Summary has been reviewed and is understood.  Full responsibility of the confidentiality of this discharge information lies with you and/or your care-partner. 

## 2015-06-07 NOTE — Progress Notes (Signed)
Report to PACU, RN, vss, BBS= Clear.  

## 2015-06-08 ENCOUNTER — Telehealth: Payer: Self-pay | Admitting: *Deleted

## 2015-06-08 NOTE — Telephone Encounter (Signed)
  Follow up Call-  Call back number 06/07/2015 02/24/2014  Post procedure Call Back phone  # [phone]  Permission to leave phone message Yes Yes     Patient questions:  Do you have a fever, pain , or abdominal swelling? No. Pain Score  0 *  Have you tolerated food without any problems? Yes.    Have you been able to return to your normal activities? Yes.    Do you have any questions about your discharge instructions: Diet   No. Medications  No. Follow up visit  No.  Do you have questions or concerns about your Care? No.  Actions: * If pain score is 4 or above: No action needed, pain <4.  Pt. Wanted information about a scan she took at [facility] offered to give number to office so she could speak with nurse and she stated that she had the number to call. There were no problems with procedure from yesterday.

## 2015-07-06 ENCOUNTER — Other Ambulatory Visit: Payer: Self-pay | Admitting: Internal Medicine

## 2015-07-06 DIAGNOSIS — N6310 Unspecified lump in the right breast, unspecified quadrant: Secondary | ICD-10-CM

## 2015-08-13 ENCOUNTER — Ambulatory Visit
Admission: RE | Admit: 2015-08-13 | Discharge: 2015-08-13 | Disposition: A | Discharge status: Home / Self Care | Payer: BLUE CROSS/BLUE SHIELD | Source: Ambulatory Visit | Attending: Internal Medicine | Admitting: Internal Medicine

## 2015-08-13 DIAGNOSIS — N6310 Unspecified lump in the right breast, unspecified quadrant: Secondary | ICD-10-CM

## 2016-01-04 ENCOUNTER — Encounter: Payer: Self-pay | Admitting: Gastroenterology

## 2016-03-30 ENCOUNTER — Encounter: Payer: Self-pay | Admitting: Gastroenterology

## 2016-04-28 ENCOUNTER — Ambulatory Visit (AMBULATORY_SURGERY_CENTER): Payer: Self-pay

## 2016-04-28 ENCOUNTER — Encounter: Payer: Self-pay | Admitting: Gastroenterology

## 2016-04-28 VITALS — Ht 68.0 in | Wt 252.6 lb

## 2016-04-28 DIAGNOSIS — K2270 Barrett's esophagus without dysplasia: Secondary | ICD-10-CM

## 2016-04-28 NOTE — Progress Notes (Signed)
No allergies to eggs or soy No past problems with anesthesia No home oxygen  No phentermine  Has email and internet; declined emmi

## 2016-05-12 ENCOUNTER — Ambulatory Visit (AMBULATORY_SURGERY_CENTER): Payer: BLUE CROSS/BLUE SHIELD | Admitting: Gastroenterology

## 2016-05-12 ENCOUNTER — Encounter: Payer: Self-pay | Admitting: Gastroenterology

## 2016-05-12 VITALS — BP 113/75 | HR 69 | Temp 99.1°F | Resp 16 | Ht 68.0 in | Wt 255.0 lb

## 2016-05-12 DIAGNOSIS — K2270 Barrett's esophagus without dysplasia: Secondary | ICD-10-CM

## 2016-05-12 MED ORDER — ESOMEPRAZOLE MAGNESIUM 40 MG PO CPDR: 40.0000 mg | DELAYED_RELEASE_CAPSULE | Freq: Two times a day (BID) | ORAL | 11 refills | Status: DC

## 2016-05-12 MED ORDER — SODIUM CHLORIDE 0.9 % IV SOLN: 500.0000 mL | INTRAVENOUS | Status: AC

## 2016-05-12 MED ORDER — ESOMEPRAZOLE MAGNESIUM 40 MG PO CPDR: 40.0000 mg | DELAYED_RELEASE_CAPSULE | Freq: Two times a day (BID) | ORAL | 3 refills | Status: DC

## 2016-05-12 NOTE — Progress Notes (Signed)
Report to PACU, RN, vss, BBS= Clear.  

## 2016-05-12 NOTE — Op Note (Signed)
[facility] Patient Name: Sara Chambers Procedure Date: 05/12/2016 10:05 AM MRN: [id number] Endoscopist: Meryl Dare , MD Age: 40 Referring MD:  Date of Birth: [birth year] Gender: Female Account #: [id number] Procedure:                Upper GI endoscopy Indications:              Screening for Barrett's esophagus Medicines:                Monitored Anesthesia Care Procedure:                Pre-Anesthesia Assessment:                           - Prior to the procedure, a History and Physical                            was performed, and patient medications and                            allergies were reviewed. The patient's tolerance of                            previous anesthesia was also reviewed. The risks                            and benefits of the procedure and the sedation                            options and risks were discussed with the patient.                            All questions were answered, and informed consent                            was obtained. Prior Anticoagulants: The patient has                            taken no previous anticoagulant or antiplatelet                            agents. ASA Grade Assessment: II - A patient with                            mild systemic disease. After reviewing the risks                            and benefits, the patient was deemed in                            satisfactory condition to undergo the procedure.                           After obtaining informed consent, the endoscope was  passed under direct vision. Throughout the                            procedure, the patient's blood pressure, pulse, and                            oxygen saturations were monitored continuously. The                            Model GIF-[id number]) scope was introduced                            through the mouth, and advanced to the second part                            of duodenum. The upper  GI endoscopy was                            accomplished without difficulty. The patient                            tolerated the procedure well. Scope In: Scope Out: Findings:                 The esophagus and gastroesophageal junction were                            examined with white light and narrow band imaging                            (NBI) from a forward view and retroflexed position.                            There were esophageal mucosal changes secondary to                            established long-segment Barrett's disease. These                            changes involved the mucosa at the upper extent of                            the gastric folds (35 cm from the incisors)                            extending to the Z-line (29 cm from the incisors).                            Circumferential salmon-colored mucosa was present                            from 29 to 35 cm. The maximum longitudinal extent  of these esophageal mucosal changes was 6 cm in                            length. Mucosa was biopsied with a cold forceps for                            histology in 4 quadrants and randomly at intervals                            of 1 cm in the lower third of the esophagus. One                            specimen bottle was sent to pathology.                           The exam of the esophagus was otherwise normal.                           A small hiatal hernia was present.                           The exam of the stomach was otherwise normal.                           The duodenal bulb and second portion of the                            duodenum were normal. Complications:            No immediate complications. Estimated blood loss:                            Minimal. Estimated Blood Loss:     Estimated blood loss was minimal. Impression:               - Esophageal mucosal changes secondary to                            established  long-segment Barrett's disease.                            Biopsied.                           - Small hiatal hernia.                           - Normal duodenal bulb and second portion of the                            duodenum. Recommendation:           - Patient has a contact number available for                            emergencies. The signs and symptoms of potential  delayed complications were discussed with the                            patient. Return to normal activities tomorrow.                            Written discharge instructions were provided to the                            patient.                           - Resume previous diet.                           - esomeprazole) 40 mg PO BID, 1 year of refills.                           - Await pathology results.                           - Repeat upper endoscopy in 3 years for                            surveillance pending pathology review.                           - Continue present medications. Meryl Dare, MD 05/12/2016 10:26:32 AM This report has been signed electronically.

## 2016-05-12 NOTE — Patient Instructions (Signed)
YOU HAD AN ENDOSCOPIC PROCEDURE TODAY AT THE [facility]:   Refer to the procedure report that was given to you for any specific questions about what was found during the examination.  If the procedure report does not answer your questions, please call your gastroenterologist to clarify.  If you requested that your care partner not be given the details of your procedure findings, then the procedure report has been included in a sealed envelope for you to review at your convenience later.  YOU SHOULD EXPECT: Some feelings of bloating in the abdomen. Passage of more gas than usual.  Walking can help get rid of the air that was put into your GI tract during the procedure and reduce the bloating. If you had a lower endoscopy (such as a colonoscopy or flexible sigmoidoscopy) you may notice spotting of blood in your stool or on the toilet paper. If you underwent a bowel prep for your procedure, you may not have a normal bowel movement for a few days.  Please Note:  You might notice some irritation and congestion in your nose or some drainage.  This is from the oxygen used during your procedure.  There is no need for concern and it should clear up in a day or so.  SYMPTOMS TO REPORT IMMEDIATELY:   Following lower endoscopy (colonoscopy or flexible sigmoidoscopy):  Excessive amounts of blood in the stool  Significant tenderness or worsening of abdominal pains  Swelling of the abdomen that is new, acute  Fever of 100F or higher   Following upper endoscopy (EGD)  Vomiting of blood or coffee ground material  New chest pain or pain under the shoulder blades  Painful or persistently difficult swallowing  New shortness of breath  Fever of 100F or higher  Black, tarry-looking stools  For urgent or emergent issues, a gastroenterologist can be reached at any hour by calling [phone].   DIET:  We do recommend a small meal at first, but then you may proceed to your regular diet.  Drink  plenty of fluids but you should avoid alcoholic beverages for 24 hours.  ACTIVITY:  You should plan to take it easy for the rest of today and you should NOT DRIVE or use heavy machinery until tomorrow (because of the sedation medicines used during the test).    FOLLOW UP: Our staff will call the number listed on your records the next business day following your procedure to check on you and address any questions or concerns that you may have regarding the information given to you following your procedure. If we do not reach you, we will leave a message.  However, if you are feeling well and you are not experiencing any problems, there is no need to return our call.  We will assume that you have returned to your regular daily activities without incident.  If any biopsies were taken you will be contacted by phone or by letter within the next 1-3 weeks.  Please call us at [phone] if you have not heard about the biopsies in 3 weeks.    SIGNATURES/CONFIDENTIALITY: You and/or your care partner have signed paperwork which will be entered into your electronic medical record.  These signatures attest to the fact that that the information above on your After Visit Summary has been reviewed and is understood.  Full responsibility of the confidentiality of this discharge information lies with you and/or your care-partner.   Barrett's esophagus and hiatal hernia information given.  esomeprzole 40mg   as directed.

## 2016-05-12 NOTE — Progress Notes (Signed)
Josh Monday, CRNA was advised #24 was used and he said as long as if dripping ok. maw

## 2016-05-13 DIAGNOSIS — K449 Diaphragmatic hernia without obstruction or gangrene: Secondary | ICD-10-CM | POA: Insufficient documentation

## 2016-05-15 ENCOUNTER — Telehealth: Payer: Self-pay | Admitting: *Deleted

## 2016-05-15 NOTE — Telephone Encounter (Signed)
  Follow up Call-  Call back number 05/12/2016 06/07/2015 02/24/2014  Post procedure Call Back phone  # #[phone] cell [phone]  Permission to leave phone message Yes Yes Yes  Some recent data might be hidden     No answer at # given.  Left message on VM.

## 2016-06-09 ENCOUNTER — Encounter: Payer: Self-pay | Admitting: Gastroenterology

## 2017-01-09 ENCOUNTER — Other Ambulatory Visit: Payer: Self-pay

## 2017-01-09 ENCOUNTER — Telehealth: Payer: Self-pay | Admitting: Gastroenterology

## 2017-01-09 ENCOUNTER — Ambulatory Visit (INDEPENDENT_AMBULATORY_CARE_PROVIDER_SITE_OTHER): Payer: BLUE CROSS/BLUE SHIELD | Admitting: Nurse Practitioner

## 2017-01-09 ENCOUNTER — Other Ambulatory Visit (INDEPENDENT_AMBULATORY_CARE_PROVIDER_SITE_OTHER): Payer: BLUE CROSS/BLUE SHIELD

## 2017-01-09 ENCOUNTER — Encounter (INDEPENDENT_AMBULATORY_CARE_PROVIDER_SITE_OTHER): Payer: Self-pay

## 2017-01-09 ENCOUNTER — Telehealth: Payer: Self-pay | Admitting: Nurse Practitioner

## 2017-01-09 ENCOUNTER — Encounter: Payer: Self-pay | Admitting: Nurse Practitioner

## 2017-01-09 VITALS — BP 120/86 | HR 84 | Ht 68.0 in | Wt 271.2 lb

## 2017-01-09 DIAGNOSIS — R079 Chest pain, unspecified: Secondary | ICD-10-CM | POA: Diagnosis not present

## 2017-01-09 DIAGNOSIS — R1013 Epigastric pain: Secondary | ICD-10-CM

## 2017-01-09 LAB — LIPASE: Lipase: 14 U/L (ref 11.0–59.0)

## 2017-01-09 LAB — HEPATIC FUNCTION PANEL
ALK PHOS: 75 U/L (ref 39–117)
ALT: 11 U/L (ref 0–35)
AST: 11 U/L (ref 0–37)
Albumin: 4.3 g/dL (ref 3.5–5.2)
BILIRUBIN DIRECT: 0.1 mg/dL (ref 0.0–0.3)
TOTAL PROTEIN: 7.4 g/dL (ref 6.0–8.3)
Total Bilirubin: 0.3 mg/dL (ref 0.2–1.2)

## 2017-01-09 LAB — TROPONIN I: TNIDX: 0 ug/l (ref 0.00–0.06)

## 2017-01-09 MED ORDER — SUCRALFATE 1 G PO TABS: 1.0000 g | ORAL_TABLET | Freq: Three times a day (TID) | ORAL | 0 refills | Status: DC

## 2017-01-09 NOTE — Telephone Encounter (Signed)
I have spoken with the patient. Please see today's labs.

## 2017-01-09 NOTE — Progress Notes (Signed)
HPI: Patient is a 41 yo female known to Dr. Russella Dar. She has a history of chronic GERD / long segment Barrett's esophagus as well as irritable bowel syndrome. In the past we have evaluated her for severe episodic chest pain / epigastric pain refractory to high dose acid suppression. She still gets breakthrough pyrosis on BID PPI and BID H2 blocker.  She is up to date on surveillance EGD, next one is due Sept 2020.    I saw patient in August 2016 for acute nausea, vomiting,  diarrhea and upper abdominal pain associated with elevated LFTs [facility]). Patient was postcholecystectomy. MRCP was obtained, no evidence for choledocholithiasis or other abnormalities. Her LFTs normalized.  She had no further episodes of this severe pain until yesterday. Since then the epigastric / distal chest pain has been constant. The pain is similar to before and feels like when she had gallbladder problems. The pain is constant but becomes very sharp and severe at times. No alleviating factors . She does have associated nausea . No shortness of breath or cough . Patient does admit to a having experienced a very stressful event at work yesterday. The pain occurred after the stress that she had eaten as well so did not think much of any correlation between the pain and stress    Past Medical History:  Diagnosis Date  . Allergy   . Anxiety   . Arthritis   . Barrett's esophagus   . Chronic headaches    migraine  . Depression   . GERD (gastroesophageal reflux disease)   . Hypertension   . IBS (irritable bowel syndrome)   . Obesity     Patient's surgical history, family medical history, social history, medications and allergies were all reviewed in Epic    Physical Exam: BP 120/86   Pulse 84   Ht  (1.727 m)   Wt 271 lb 3.2 oz (123 kg)   BMI 41.24 kg/m   GENERAL: obese, white female in NAD PSYCH: :Pleasant, cooperative, normal affect EENT:  conjunctiva pink, mucous membranes moist, neck  supple without masses CARDIAC:  RRR, no murmur heard, no peripheral edema PULM: Normal respiratory effort, lungs CTA bilaterally, no wheezing ABDOMEN:  soft, nontender, nondistended, no obvious masses, no hepatomegaly,  normal bowel sounds SKIN:  turgor, no lesions seen Musculoskeletal:  Normal muscle tone, normal strength NEURO: Alert and oriented x 3, no focal neurologic deficits    ASSESSMENT and PLAN:  1. 41 yo female with another episode of severe chest / epigastric pain. Episode started yesterday, she has had constant discomfort since onset. Patient has chronic GERD but this does not feel like GERD. This feels like when she had gallbladder problems. The last time episode occurred patient went to [facility] and was found to have a significant rise in her LFTs. Subsequent MRCP was unrevealing and LFTs normalized. Etiology of pain not quite clear. His could be anxiety related, musculoskeletal versus biliary versus GERD -check lipase, LFTs now. Doubt cardiac but check troponin.  -trial of carafate  -I will call her with results.   2. GERD / Barrett's / small hiatal hernia.  -Despite BID PPI and BID H2 blocker she still gets frequent breakthrough heartburn. At some point we may consider a 24 hour pH study on meds to help determine if her symptoms are truly acid related. Hard to believe acid not adequately suppressed given amount of acid suppressants she takes    Sara Chambers , NP 01/09/2017, 9:18 AM

## 2017-01-09 NOTE — Telephone Encounter (Signed)
Patient reports that she is having CP/epigastric pain that started yesterday.  She has some nausea. She reports pain started after a meal.  Pain is constant.  She says these symptoms are consistent with the attack she had about 2 years ago.  It was determined that she had passed a  probable stone. She was instructed with the last episode to call and report her symptoms immediately. She will come in and see Willette Cluster RNP at 9:00

## 2017-01-09 NOTE — Telephone Encounter (Signed)
Patient calling back regarding this.  °

## 2017-01-09 NOTE — Patient Instructions (Signed)
Your physician has requested that you go to the basement for the following lab work before leaving today: Lipase, hepatic function, tropinin  We will call you with lab results  If you are age 43 or older, your body mass index should be between 23-30. Your Body mass index is 41.24 kg/m. If this is out of the aforementioned range listed, please consider follow up with your Primary Care Provider.  If you are age 66 or younger, your body mass index should be between 19-25. Your Body mass index is 41.24 kg/m. If this is out of the aformentioned range listed, please consider follow up with your Primary Care Provider.

## 2017-01-10 DIAGNOSIS — Z79899 Other long term (current) drug therapy: Secondary | ICD-10-CM | POA: Insufficient documentation

## 2017-01-11 ENCOUNTER — Other Ambulatory Visit: Payer: Self-pay

## 2017-01-11 ENCOUNTER — Telehealth: Payer: Self-pay | Admitting: Nurse Practitioner

## 2017-01-11 MED ORDER — SUCRALFATE 1 G PO TABS: 1.0000 g | ORAL_TABLET | Freq: Three times a day (TID) | ORAL | 0 refills | Status: DC

## 2017-01-11 NOTE — Progress Notes (Signed)
Reviewed and agree with management plan.  Amario Longmore T. Shneur Whittenburg, MD FACG 

## 2017-01-12 ENCOUNTER — Other Ambulatory Visit: Payer: Self-pay

## 2017-01-12 ENCOUNTER — Telehealth: Payer: Self-pay

## 2017-01-12 MED ORDER — DEXLANSOPRAZOLE 30 MG PO CPDR: 30.0000 mg | DELAYED_RELEASE_CAPSULE | Freq: Every day | ORAL | 1 refills | Status: DC

## 2017-01-12 NOTE — Telephone Encounter (Signed)
Spoke with Willette Cluster NP. Patient needs to stop Nexium. Continue other medications. Try Dexilant 30 mg daily. No samples available. Return to care in 1 month.

## 2017-01-12 NOTE — Telephone Encounter (Signed)
Called to the patient and got her voicemail. Carafate sent to [facility] in [place] yesterday at 4:54. Call if there are any other concerns.

## 2017-02-07 ENCOUNTER — Ambulatory Visit: Payer: BLUE CROSS/BLUE SHIELD | Admitting: Nurse Practitioner

## 2017-02-20 ENCOUNTER — Ambulatory Visit: Payer: BLUE CROSS/BLUE SHIELD | Admitting: Nurse Practitioner

## 2017-02-22 ENCOUNTER — Ambulatory Visit: Payer: BLUE CROSS/BLUE SHIELD | Admitting: Nurse Practitioner

## 2017-02-27 ENCOUNTER — Encounter: Payer: Self-pay | Admitting: Nurse Practitioner

## 2017-02-27 ENCOUNTER — Encounter (INDEPENDENT_AMBULATORY_CARE_PROVIDER_SITE_OTHER): Payer: Self-pay

## 2017-02-27 ENCOUNTER — Ambulatory Visit (INDEPENDENT_AMBULATORY_CARE_PROVIDER_SITE_OTHER): Payer: BLUE CROSS/BLUE SHIELD | Admitting: Nurse Practitioner

## 2017-02-27 VITALS — BP 116/68 | HR 61 | Ht 68.0 in | Wt 271.0 lb

## 2017-02-27 DIAGNOSIS — K219 Gastro-esophageal reflux disease without esophagitis: Secondary | ICD-10-CM | POA: Diagnosis not present

## 2017-02-27 DIAGNOSIS — K581 Irritable bowel syndrome with constipation: Secondary | ICD-10-CM | POA: Diagnosis not present

## 2017-02-27 MED ORDER — LINACLOTIDE 72 MCG PO CAPS: 72.0000 ug | ORAL_CAPSULE | Freq: Every day | ORAL | 3 refills | Status: DC

## 2017-02-27 MED ORDER — METOCLOPRAMIDE HCL 5 MG PO TABS: ORAL_TABLET | ORAL | 3 refills | Status: DC

## 2017-02-27 MED ORDER — DEXLANSOPRAZOLE 30 MG PO CPDR: 30.0000 mg | DELAYED_RELEASE_CAPSULE | Freq: Two times a day (BID) | ORAL | 3 refills | Status: DC

## 2017-02-27 NOTE — Progress Notes (Signed)
HPI: Patient is a 41 year old female known to Dr. Russella DarStark. She has a history of IBS, chronic GERD/ long segment Barrett's, surveillance EGD due to 2020 .  We've evaluated her in the past for severe chest pain/epigastric pain refractory to high-dose acid suppression.  I saw her in mid April of this year with these symptoms. [facility], found to have a significant rise in her LFTs. Subsequent MRCP was unrevealing and her LFTs normalize.  Sara Chambers is in today to discuss GERD medication. She has severe GERD with heartburn and regurgitation. At our last visit I tried her on Dexilant 30 mg in the morning and Carafate 4 times daily as needed. She had difficulty with Carafate due to large tablet size. She had to increase the Dexilant to 30 mg twice daily because of recurrent evening symptoms. The whole time she was still taking Zantac twice a day. Sara Chambers sleeps on a wedge pillow. She is working odd hours right now making it difficult to get home early so not always able to go to bed on an empty stomach. She is thinking about Weight Watchers for weight loss to help with the GERD. She describes occasional nausea and vomiting. Emesis contains food eaten hours earlier  Sara Chambers has irritable bowel syndrome. She struggles with constipation. MiraLAX once daily caused loose stools. She inquires about Linzess. He has occasional lower abdominal cramping but it's not a predominant symptom of her IBS.  Past Medical History:  Diagnosis Date  . Allergy   . Anxiety   . Arthritis   . Barrett's esophagus   . Chronic headaches    migraine  . Depression   . GERD (gastroesophageal reflux disease)   . Hypertension   . IBS (irritable bowel syndrome)   . Obesity     Patient's surgical history, family medical history, social history, medications and allergies were all reviewed in Epic    Physical Exam: BP 116/68 (BP Location: Right Arm, Patient Position: Sitting, Cuff Size: Large)   Pulse 61   Ht 5'  8" (1.727 m)   Wt 271 lb (122.9 kg)   BMI 41.21 kg/m   GENERAL: Obese white female in NAD PSYCH: :Pleasant, cooperative, normal affect EENT:  conjunctiva pink, mucous membranes moist, neck supple without masses CARDIAC:  RRR, no murmur heard, no peripheral edema PULM: Normal respiratory effort, lungs CTA bilaterally, no wheezing ABDOMEN:  soft, nontender, nondistended, no obvious masses, no hepatomegaly,  normal bowel sounds SKIN:  turgor, no lesions seen Musculoskeletal: normal muscle tone, normal strength NEURO: Alert and oriented x 3, no focal neurologic deficits   ASSESSMENT and PLAN:  1.  GERD / long segment Barrett's esophagus. Pleasant 41 year old female with severe GERD symptoms despite high-dose acid suppression with PPI and H2 blockers.The Dexilant 30mg  daily helped but had to be increased to BID and that is in addition to BID Zantac. Overall tries to adhere to anti-reflux measures.  -Continue anti-reflux measures. -Dexilant 30 mg BID if insurance will pay. If not then try Dexilant 60mg  in am  -Continue Zantac 150 mg BID -Encourage weight loss, she is thinking about Weight Watchers.  The amount of acid suppressants she is taking is concerning. She has occasional vomiting of undigested food so I wonder about delayed gastric emptying contributing to uncontrolled GERD. Trial of low dose Reglan at bedtime since regurgitation / heartburn worse at night. Potential side effects of Reglan discussed, she will stop the medication immediately should any of these occur -  EGD for Barrett's surveillance due in 2020  2.  Chronic constipation. MiraLAX resulted in too loose of stools. Patient would like to try Linzess. Of note she had a normal colonoscopy done for evaluation of GI bleeding September 2016. Next colonoscopy due 2026 -Trial of Linzess 72 mcg daily   Sara Chambers , NP 02/27/2017, 8:42 AM

## 2017-02-27 NOTE — Patient Instructions (Signed)
If you are age 43 or older, your body mass index should be between 23-30. Your Body mass index is 41.21 kg/m. If this is out of the aforementioned range listed, please consider follow up with your Primary Care Provider.  If you are age 43 or younger, your body mass index should be between 19-25. Your Body mass index is 41.21 kg/m. If this is out of the aformentioned range listed, please consider follow up with your Primary Care Provider.   We have sent the following medications to your pharmacy for you to pick up at your convenience: Linzess Dexilant Reglan  Follow up as needed.  Thank you for choosing me and [facility].   Willette ClusterPaula Guenther, NP

## 2017-02-28 ENCOUNTER — Other Ambulatory Visit: Payer: Self-pay

## 2017-02-28 MED ORDER — DEXLANSOPRAZOLE 60 MG PO CPDR: 60.0000 mg | DELAYED_RELEASE_CAPSULE | ORAL | 3 refills | Status: DC

## 2017-02-28 NOTE — Telephone Encounter (Signed)
Ins will not cover Dexilant 30 mg bid; a new prescription was sent to the pharmacy for Dexilant 60mg  q a.m.  Pt aware.

## 2017-02-28 NOTE — Progress Notes (Signed)
Reviewed and agree with management plan.  Areen Trautner T. Cecile Guevara, MD FACG 

## 2017-03-05 ENCOUNTER — Telehealth: Payer: Self-pay | Admitting: Emergency Medicine

## 2017-03-05 NOTE — Telephone Encounter (Signed)
Dexilant not covered by insurance for twice a day. They will cover once a day. Or would you like to switch to something else?

## 2017-03-07 NOTE — Telephone Encounter (Signed)
We discussed trying dexilant 60 in am, continue Zantac in evening. Let's see if they will pay for this. If not then will try something different. Thanks

## 2017-03-08 NOTE — Telephone Encounter (Signed)
Spoke with patient and informed her of medication changes. She states the pharmacy gave her 60 mg and that she will take it in the morning and Zantac in the evenings. Pt verbalized understanding.

## 2017-03-16 ENCOUNTER — Ambulatory Visit: Payer: BLUE CROSS/BLUE SHIELD | Admitting: Family Medicine

## 2017-05-09 ENCOUNTER — Other Ambulatory Visit: Payer: Self-pay | Admitting: Physician Assistant

## 2017-05-09 DIAGNOSIS — M5412 Radiculopathy, cervical region: Secondary | ICD-10-CM

## 2017-05-17 ENCOUNTER — Ambulatory Visit
Admission: RE | Admit: 2017-05-17 | Discharge: 2017-05-17 | Disposition: A | Discharge status: Home / Self Care | Payer: BLUE CROSS/BLUE SHIELD | Source: Ambulatory Visit | Attending: Physician Assistant | Admitting: Physician Assistant

## 2017-05-17 DIAGNOSIS — M5412 Radiculopathy, cervical region: Secondary | ICD-10-CM

## 2017-06-05 ENCOUNTER — Telehealth: Payer: Self-pay | Admitting: Nurse Practitioner

## 2017-06-05 MED ORDER — METOCLOPRAMIDE HCL 5 MG PO TABS: ORAL_TABLET | ORAL | 1 refills | Status: DC

## 2017-06-05 MED ORDER — DEXLANSOPRAZOLE 60 MG PO CPDR
60.0000 mg | DELAYED_RELEASE_CAPSULE | ORAL | 1 refills | Status: DC
Start: 2017-06-05 — End: 2017-11-19

## 2017-06-05 NOTE — Telephone Encounter (Signed)
90 day supply sent to [facility].

## 2017-06-07 ENCOUNTER — Other Ambulatory Visit: Payer: Self-pay | Admitting: Sports Medicine

## 2017-06-07 DIAGNOSIS — M25511 Pain in right shoulder: Principal | ICD-10-CM

## 2017-06-07 DIAGNOSIS — G8929 Other chronic pain: Secondary | ICD-10-CM

## 2017-06-17 ENCOUNTER — Ambulatory Visit
Admission: RE | Admit: 2017-06-17 | Discharge: 2017-06-17 | Disposition: A | Discharge status: Home / Self Care | Payer: BLUE CROSS/BLUE SHIELD | Source: Ambulatory Visit | Attending: Sports Medicine | Admitting: Sports Medicine

## 2017-06-17 DIAGNOSIS — G8929 Other chronic pain: Secondary | ICD-10-CM

## 2017-06-17 DIAGNOSIS — M25511 Pain in right shoulder: Principal | ICD-10-CM

## 2017-06-20 ENCOUNTER — Other Ambulatory Visit: Payer: BLUE CROSS/BLUE SHIELD

## 2017-06-27 DIAGNOSIS — M75121 Complete rotator cuff tear or rupture of right shoulder, not specified as traumatic: Secondary | ICD-10-CM | POA: Diagnosis not present

## 2017-07-04 DIAGNOSIS — M25511 Pain in right shoulder: Secondary | ICD-10-CM | POA: Diagnosis not present

## 2017-07-18 DIAGNOSIS — F419 Anxiety disorder, unspecified: Secondary | ICD-10-CM | POA: Diagnosis not present

## 2017-07-18 DIAGNOSIS — Z79899 Other long term (current) drug therapy: Secondary | ICD-10-CM | POA: Diagnosis not present

## 2017-08-26 DIAGNOSIS — S63501A Unspecified sprain of right wrist, initial encounter: Secondary | ICD-10-CM | POA: Diagnosis not present

## 2017-08-26 DIAGNOSIS — M542 Cervicalgia: Secondary | ICD-10-CM | POA: Diagnosis not present

## 2017-08-26 DIAGNOSIS — M25531 Pain in right wrist: Secondary | ICD-10-CM | POA: Diagnosis not present

## 2017-08-26 DIAGNOSIS — S1093XA Contusion of unspecified part of neck, initial encounter: Secondary | ICD-10-CM | POA: Diagnosis not present

## 2017-09-06 DIAGNOSIS — M7541 Impingement syndrome of right shoulder: Secondary | ICD-10-CM | POA: Diagnosis not present

## 2017-09-06 DIAGNOSIS — M75121 Complete rotator cuff tear or rupture of right shoulder, not specified as traumatic: Secondary | ICD-10-CM | POA: Diagnosis not present

## 2017-09-06 DIAGNOSIS — S46011A Strain of muscle(s) and tendon(s) of the rotator cuff of right shoulder, initial encounter: Secondary | ICD-10-CM | POA: Diagnosis not present

## 2017-09-06 DIAGNOSIS — M24111 Other articular cartilage disorders, right shoulder: Secondary | ICD-10-CM | POA: Diagnosis not present

## 2017-09-06 DIAGNOSIS — G8918 Other acute postprocedural pain: Secondary | ICD-10-CM | POA: Diagnosis not present

## 2017-09-12 DIAGNOSIS — S46011D Strain of muscle(s) and tendon(s) of the rotator cuff of right shoulder, subsequent encounter: Secondary | ICD-10-CM | POA: Diagnosis not present

## 2017-09-12 DIAGNOSIS — M7541 Impingement syndrome of right shoulder: Secondary | ICD-10-CM | POA: Diagnosis not present

## 2017-09-16 DIAGNOSIS — M25511 Pain in right shoulder: Secondary | ICD-10-CM | POA: Diagnosis not present

## 2017-09-16 DIAGNOSIS — M25521 Pain in right elbow: Secondary | ICD-10-CM | POA: Diagnosis not present

## 2017-09-19 DIAGNOSIS — Z79899 Other long term (current) drug therapy: Secondary | ICD-10-CM | POA: Diagnosis not present

## 2017-09-19 DIAGNOSIS — M25531 Pain in right wrist: Secondary | ICD-10-CM | POA: Diagnosis not present

## 2017-09-19 DIAGNOSIS — F419 Anxiety disorder, unspecified: Secondary | ICD-10-CM | POA: Diagnosis not present

## 2017-09-19 DIAGNOSIS — M25511 Pain in right shoulder: Secondary | ICD-10-CM | POA: Diagnosis not present

## 2017-09-19 DIAGNOSIS — M542 Cervicalgia: Secondary | ICD-10-CM | POA: Diagnosis not present

## 2017-09-20 ENCOUNTER — Other Ambulatory Visit: Payer: Self-pay | Admitting: Orthopedic Surgery

## 2017-09-20 DIAGNOSIS — M25511 Pain in right shoulder: Secondary | ICD-10-CM

## 2017-10-01 ENCOUNTER — Ambulatory Visit
Admission: RE | Admit: 2017-10-01 | Discharge: 2017-10-01 | Disposition: A | Discharge status: Home / Self Care | Payer: BLUE CROSS/BLUE SHIELD | Source: Ambulatory Visit | Attending: Orthopedic Surgery | Admitting: Orthopedic Surgery

## 2017-10-01 DIAGNOSIS — M25511 Pain in right shoulder: Secondary | ICD-10-CM

## 2017-10-01 DIAGNOSIS — S4991XA Unspecified injury of right shoulder and upper arm, initial encounter: Secondary | ICD-10-CM | POA: Diagnosis not present

## 2017-10-01 DIAGNOSIS — S46011A Strain of muscle(s) and tendon(s) of the rotator cuff of right shoulder, initial encounter: Secondary | ICD-10-CM | POA: Diagnosis not present

## 2017-10-01 MED ORDER — IOPAMIDOL (ISOVUE-M 200) INJECTION 41%
20.0000 mL | Freq: Once | INTRAMUSCULAR | Status: AC
  Administered 2017-10-01: 20 mL via INTRA_ARTICULAR

## 2017-10-03 DIAGNOSIS — M25511 Pain in right shoulder: Secondary | ICD-10-CM | POA: Diagnosis not present

## 2017-10-09 ENCOUNTER — Other Ambulatory Visit: Payer: Self-pay

## 2017-10-09 ENCOUNTER — Other Ambulatory Visit: Payer: Self-pay | Admitting: Obstetrics & Gynecology

## 2017-10-09 DIAGNOSIS — N6489 Other specified disorders of breast: Secondary | ICD-10-CM

## 2017-10-15 DIAGNOSIS — S46011D Strain of muscle(s) and tendon(s) of the rotator cuff of right shoulder, subsequent encounter: Secondary | ICD-10-CM | POA: Diagnosis not present

## 2017-10-17 DIAGNOSIS — Z808 Family history of malignant neoplasm of other organs or systems: Secondary | ICD-10-CM | POA: Diagnosis not present

## 2017-10-17 DIAGNOSIS — Z01419 Encounter for gynecological examination (general) (routine) without abnormal findings: Secondary | ICD-10-CM | POA: Diagnosis not present

## 2017-10-17 DIAGNOSIS — Z803 Family history of malignant neoplasm of breast: Secondary | ICD-10-CM | POA: Diagnosis not present

## 2017-10-17 DIAGNOSIS — Z1329 Encounter for screening for other suspected endocrine disorder: Secondary | ICD-10-CM | POA: Diagnosis not present

## 2017-10-17 DIAGNOSIS — Z8349 Family history of other endocrine, nutritional and metabolic diseases: Secondary | ICD-10-CM | POA: Diagnosis not present

## 2017-10-17 DIAGNOSIS — Z800 Family history of malignant neoplasm of digestive organs: Secondary | ICD-10-CM | POA: Diagnosis not present

## 2017-10-17 DIAGNOSIS — Z6839 Body mass index (BMI) 39.0-39.9, adult: Secondary | ICD-10-CM | POA: Diagnosis not present

## 2017-10-19 DIAGNOSIS — M25511 Pain in right shoulder: Secondary | ICD-10-CM | POA: Diagnosis not present

## 2017-10-19 DIAGNOSIS — M25611 Stiffness of right shoulder, not elsewhere classified: Secondary | ICD-10-CM | POA: Diagnosis not present

## 2017-10-19 DIAGNOSIS — M6281 Muscle weakness (generalized): Secondary | ICD-10-CM | POA: Diagnosis not present

## 2017-10-19 DIAGNOSIS — M75101 Unspecified rotator cuff tear or rupture of right shoulder, not specified as traumatic: Secondary | ICD-10-CM | POA: Diagnosis not present

## 2017-10-24 DIAGNOSIS — M25611 Stiffness of right shoulder, not elsewhere classified: Secondary | ICD-10-CM | POA: Diagnosis not present

## 2017-10-24 DIAGNOSIS — M6281 Muscle weakness (generalized): Secondary | ICD-10-CM | POA: Diagnosis not present

## 2017-10-24 DIAGNOSIS — M75101 Unspecified rotator cuff tear or rupture of right shoulder, not specified as traumatic: Secondary | ICD-10-CM | POA: Diagnosis not present

## 2017-10-24 DIAGNOSIS — M25511 Pain in right shoulder: Secondary | ICD-10-CM | POA: Diagnosis not present

## 2017-10-25 DIAGNOSIS — M25511 Pain in right shoulder: Secondary | ICD-10-CM | POA: Diagnosis not present

## 2017-10-25 DIAGNOSIS — M6281 Muscle weakness (generalized): Secondary | ICD-10-CM | POA: Diagnosis not present

## 2017-10-25 DIAGNOSIS — M75101 Unspecified rotator cuff tear or rupture of right shoulder, not specified as traumatic: Secondary | ICD-10-CM | POA: Diagnosis not present

## 2017-10-25 DIAGNOSIS — M25611 Stiffness of right shoulder, not elsewhere classified: Secondary | ICD-10-CM | POA: Diagnosis not present

## 2017-10-30 ENCOUNTER — Ambulatory Visit
Admission: RE | Admit: 2017-10-30 | Discharge: 2017-10-30 | Disposition: A | Discharge status: Home / Self Care | Payer: BLUE CROSS/BLUE SHIELD | Source: Ambulatory Visit | Attending: Obstetrics & Gynecology | Admitting: Obstetrics & Gynecology

## 2017-10-30 ENCOUNTER — Ambulatory Visit: Payer: BLUE CROSS/BLUE SHIELD

## 2017-10-30 DIAGNOSIS — M6281 Muscle weakness (generalized): Secondary | ICD-10-CM | POA: Diagnosis not present

## 2017-10-30 DIAGNOSIS — M25511 Pain in right shoulder: Secondary | ICD-10-CM | POA: Diagnosis not present

## 2017-10-30 DIAGNOSIS — M25611 Stiffness of right shoulder, not elsewhere classified: Secondary | ICD-10-CM | POA: Diagnosis not present

## 2017-10-30 DIAGNOSIS — M75101 Unspecified rotator cuff tear or rupture of right shoulder, not specified as traumatic: Secondary | ICD-10-CM | POA: Diagnosis not present

## 2017-10-30 DIAGNOSIS — R928 Other abnormal and inconclusive findings on diagnostic imaging of breast: Secondary | ICD-10-CM | POA: Diagnosis not present

## 2017-10-30 DIAGNOSIS — N6489 Other specified disorders of breast: Secondary | ICD-10-CM

## 2017-10-31 DIAGNOSIS — M25511 Pain in right shoulder: Secondary | ICD-10-CM | POA: Diagnosis not present

## 2017-10-31 DIAGNOSIS — M75101 Unspecified rotator cuff tear or rupture of right shoulder, not specified as traumatic: Secondary | ICD-10-CM | POA: Diagnosis not present

## 2017-10-31 DIAGNOSIS — M25611 Stiffness of right shoulder, not elsewhere classified: Secondary | ICD-10-CM | POA: Diagnosis not present

## 2017-10-31 DIAGNOSIS — M6281 Muscle weakness (generalized): Secondary | ICD-10-CM | POA: Diagnosis not present

## 2017-11-06 DIAGNOSIS — M75101 Unspecified rotator cuff tear or rupture of right shoulder, not specified as traumatic: Secondary | ICD-10-CM | POA: Diagnosis not present

## 2017-11-06 DIAGNOSIS — M25511 Pain in right shoulder: Secondary | ICD-10-CM | POA: Diagnosis not present

## 2017-11-06 DIAGNOSIS — M6281 Muscle weakness (generalized): Secondary | ICD-10-CM | POA: Diagnosis not present

## 2017-11-06 DIAGNOSIS — M25611 Stiffness of right shoulder, not elsewhere classified: Secondary | ICD-10-CM | POA: Diagnosis not present

## 2017-11-08 DIAGNOSIS — M25511 Pain in right shoulder: Secondary | ICD-10-CM | POA: Diagnosis not present

## 2017-11-08 DIAGNOSIS — M75101 Unspecified rotator cuff tear or rupture of right shoulder, not specified as traumatic: Secondary | ICD-10-CM | POA: Diagnosis not present

## 2017-11-08 DIAGNOSIS — M6281 Muscle weakness (generalized): Secondary | ICD-10-CM | POA: Diagnosis not present

## 2017-11-08 DIAGNOSIS — M25611 Stiffness of right shoulder, not elsewhere classified: Secondary | ICD-10-CM | POA: Diagnosis not present

## 2017-11-12 DIAGNOSIS — M75101 Unspecified rotator cuff tear or rupture of right shoulder, not specified as traumatic: Secondary | ICD-10-CM | POA: Diagnosis not present

## 2017-11-12 DIAGNOSIS — M25611 Stiffness of right shoulder, not elsewhere classified: Secondary | ICD-10-CM | POA: Diagnosis not present

## 2017-11-12 DIAGNOSIS — M25511 Pain in right shoulder: Secondary | ICD-10-CM | POA: Diagnosis not present

## 2017-11-12 DIAGNOSIS — M6281 Muscle weakness (generalized): Secondary | ICD-10-CM | POA: Diagnosis not present

## 2017-11-14 DIAGNOSIS — M25611 Stiffness of right shoulder, not elsewhere classified: Secondary | ICD-10-CM | POA: Diagnosis not present

## 2017-11-14 DIAGNOSIS — M75101 Unspecified rotator cuff tear or rupture of right shoulder, not specified as traumatic: Secondary | ICD-10-CM | POA: Diagnosis not present

## 2017-11-14 DIAGNOSIS — M25511 Pain in right shoulder: Secondary | ICD-10-CM | POA: Diagnosis not present

## 2017-11-14 DIAGNOSIS — M6281 Muscle weakness (generalized): Secondary | ICD-10-CM | POA: Diagnosis not present

## 2017-11-19 ENCOUNTER — Other Ambulatory Visit: Payer: Self-pay | Admitting: Nurse Practitioner

## 2017-11-20 DIAGNOSIS — M25611 Stiffness of right shoulder, not elsewhere classified: Secondary | ICD-10-CM | POA: Diagnosis not present

## 2017-11-20 DIAGNOSIS — M75101 Unspecified rotator cuff tear or rupture of right shoulder, not specified as traumatic: Secondary | ICD-10-CM | POA: Diagnosis not present

## 2017-11-20 DIAGNOSIS — M25511 Pain in right shoulder: Secondary | ICD-10-CM | POA: Diagnosis not present

## 2017-11-20 DIAGNOSIS — M6281 Muscle weakness (generalized): Secondary | ICD-10-CM | POA: Diagnosis not present

## 2017-11-22 DIAGNOSIS — M75101 Unspecified rotator cuff tear or rupture of right shoulder, not specified as traumatic: Secondary | ICD-10-CM | POA: Diagnosis not present

## 2017-11-22 DIAGNOSIS — M25611 Stiffness of right shoulder, not elsewhere classified: Secondary | ICD-10-CM | POA: Diagnosis not present

## 2017-11-22 DIAGNOSIS — M25511 Pain in right shoulder: Secondary | ICD-10-CM | POA: Diagnosis not present

## 2017-11-22 DIAGNOSIS — M6281 Muscle weakness (generalized): Secondary | ICD-10-CM | POA: Diagnosis not present

## 2017-11-27 DIAGNOSIS — M25511 Pain in right shoulder: Secondary | ICD-10-CM | POA: Diagnosis not present

## 2017-11-27 DIAGNOSIS — M6281 Muscle weakness (generalized): Secondary | ICD-10-CM | POA: Diagnosis not present

## 2017-11-27 DIAGNOSIS — M25611 Stiffness of right shoulder, not elsewhere classified: Secondary | ICD-10-CM | POA: Diagnosis not present

## 2017-11-27 DIAGNOSIS — M75101 Unspecified rotator cuff tear or rupture of right shoulder, not specified as traumatic: Secondary | ICD-10-CM | POA: Diagnosis not present

## 2017-11-29 DIAGNOSIS — M25511 Pain in right shoulder: Secondary | ICD-10-CM | POA: Diagnosis not present

## 2017-11-29 DIAGNOSIS — M75101 Unspecified rotator cuff tear or rupture of right shoulder, not specified as traumatic: Secondary | ICD-10-CM | POA: Diagnosis not present

## 2017-11-29 DIAGNOSIS — M25611 Stiffness of right shoulder, not elsewhere classified: Secondary | ICD-10-CM | POA: Diagnosis not present

## 2017-11-29 DIAGNOSIS — M6281 Muscle weakness (generalized): Secondary | ICD-10-CM | POA: Diagnosis not present

## 2017-12-03 DIAGNOSIS — M25611 Stiffness of right shoulder, not elsewhere classified: Secondary | ICD-10-CM | POA: Diagnosis not present

## 2017-12-03 DIAGNOSIS — M75101 Unspecified rotator cuff tear or rupture of right shoulder, not specified as traumatic: Secondary | ICD-10-CM | POA: Diagnosis not present

## 2017-12-03 DIAGNOSIS — M6281 Muscle weakness (generalized): Secondary | ICD-10-CM | POA: Diagnosis not present

## 2017-12-03 DIAGNOSIS — M25511 Pain in right shoulder: Secondary | ICD-10-CM | POA: Diagnosis not present

## 2017-12-06 DIAGNOSIS — Z809 Family history of malignant neoplasm, unspecified: Secondary | ICD-10-CM | POA: Diagnosis not present

## 2017-12-06 DIAGNOSIS — J018 Other acute sinusitis: Secondary | ICD-10-CM | POA: Diagnosis not present

## 2017-12-06 DIAGNOSIS — R05 Cough: Secondary | ICD-10-CM | POA: Diagnosis not present

## 2017-12-06 DIAGNOSIS — R509 Fever, unspecified: Secondary | ICD-10-CM | POA: Diagnosis not present

## 2017-12-06 DIAGNOSIS — J029 Acute pharyngitis, unspecified: Secondary | ICD-10-CM | POA: Diagnosis not present

## 2017-12-26 DIAGNOSIS — M75101 Unspecified rotator cuff tear or rupture of right shoulder, not specified as traumatic: Secondary | ICD-10-CM | POA: Diagnosis not present

## 2017-12-27 DIAGNOSIS — Z6837 Body mass index (BMI) 37.0-37.9, adult: Secondary | ICD-10-CM | POA: Diagnosis not present

## 2017-12-27 DIAGNOSIS — Z1389 Encounter for screening for other disorder: Secondary | ICD-10-CM | POA: Diagnosis not present

## 2017-12-27 DIAGNOSIS — F320 Major depressive disorder, single episode, mild: Secondary | ICD-10-CM | POA: Diagnosis not present

## 2018-01-25 DIAGNOSIS — Z1389 Encounter for screening for other disorder: Secondary | ICD-10-CM | POA: Diagnosis not present

## 2018-01-25 DIAGNOSIS — Z6837 Body mass index (BMI) 37.0-37.9, adult: Secondary | ICD-10-CM | POA: Diagnosis not present

## 2018-01-25 DIAGNOSIS — F320 Major depressive disorder, single episode, mild: Secondary | ICD-10-CM | POA: Diagnosis not present

## 2018-04-02 ENCOUNTER — Encounter: Payer: Self-pay | Admitting: Sports Medicine

## 2018-04-02 ENCOUNTER — Ambulatory Visit (INDEPENDENT_AMBULATORY_CARE_PROVIDER_SITE_OTHER): Payer: BLUE CROSS/BLUE SHIELD | Admitting: Sports Medicine

## 2018-04-02 DIAGNOSIS — M79674 Pain in right toe(s): Secondary | ICD-10-CM | POA: Diagnosis not present

## 2018-04-02 DIAGNOSIS — L600 Ingrowing nail: Secondary | ICD-10-CM

## 2018-04-02 MED ORDER — NEOMYCIN-POLYMYXIN-HC 3.5-10000-1 OT SOLN: OTIC | 0 refills | Status: DC

## 2018-04-02 NOTE — Progress Notes (Signed)
Subjective: Sara Chambers is a 42 y.o. female patient presents to office today complaining of a mildly painful incurvated, swollen medial nail border of the 1st toe on the right foot. This has been present for several months with history of previous removal 15 years ago that was traumatic. Patient has treated this by trimming dead skin and soaking and neosporin. Patient denies fever/chills/nausea/vomitting/any other related constitutional symptoms at this time.  Review of Systems  Skin:       R Toe pain  All other systems reviewed and are negative.    Patient Active Problem List   Diagnosis Date Noted  . Abnormal ultrasound 04/29/2015  . Elevated LFTs 04/29/2015  . Upper abdominal pain 04/29/2015  . Abnormal findings on radiological examination of gastrointestinal tract 04/29/2015  . Arthritis of knee, degenerative 09/01/2014  . Barrett esophagus 03/09/2014  . Adaptive colitis 06/28/2012  . Cephalalgia 06/10/2010  . Cannot sleep 06/10/2010    Current Outpatient Medications on File Prior to Visit  Medication Sig Dispense Refill  . ALPRAZolam (XANAX) 0.25 MG tablet Take 0.25 mg by mouth 3 (three) times daily.    . celecoxib (CELEBREX) 200 MG capsule Take 200 mg by mouth daily.    . cetirizine (ZYRTEC ALLERGY) 10 MG tablet Take 10 mg by mouth daily.    . cyclobenzaprine (FLEXERIL) 5 MG tablet Take by mouth as needed.    Marland Kitchen DEXILANT 60 MG capsule TAKE ONE CAPSULE BY MOUTH EVERY MORNING 90 capsule 1  . hydrOXYzine (ATARAX/VISTARIL) 10 MG tablet Take 10 mg by mouth 3 (three) times daily as needed.    . linaclotide (LINZESS) 72 MCG capsule Take 1 capsule (72 mcg total) by mouth daily before breakfast. 30 capsule 3  . metoCLOPramide (REGLAN) 5 MG tablet TAKE ONE TABLET 30 MINUTES BEFORE DINNER 90 tablet 1  . Probiotic Product (PROBIOTIC DAILY) CAPS Take by mouth.    . ranitidine (ZANTAC) 150 MG capsule Take 150 mg by mouth 2 (two) times daily.    . rizatriptan (MAXALT) 10 MG tablet Take 10 mg  by mouth as needed.    . sucralfate (CARAFATE) 1 g tablet Take 1 tablet (1 g total) by mouth 3 (three) times daily before meals. 90 tablet 0   Current Facility-Administered Medications on File Prior to Visit  Medication Dose Route Frequency Provider Last Rate Last Dose  . 0.9 %  sodium chloride infusion  500 mL Intravenous Continuous Meryl Dare, MD        Allergies  Allergen Reactions  . Biaxin [Clarithromycin] Nausea And Vomiting  . Citalopram Hydrobromide     Pt does not know reaction  . Codeine Nausea And Vomiting  . Penicillins Rash    Objective:  There were no vitals filed for this visit.  General: Well developed, nourished, in no acute distress, alert and oriented x3   Dermatology: Skin is warm, dry and supple bilateral. RIght hallux nail appears to be mildly incurvated with hyperkeratosis formation at the distal aspects of  the medial nail border. (-) Erythema. (+) Edema. (-) serosanguous  drainage present. The remaining nails appear unremarkable at this time. There are no open sores, lesions or other signs of infection  present.  Vascular: Dorsalis Pedis artery and Posterior Tibial artery pedal pulses are 2/4 bilateral with immedate capillary fill time. Pedal hair growth present. No lower extremity edema.   Neruologic: Grossly intact via light touch bilateral.  Musculoskeletal: Tenderness to palpation of the Right hallux medial nail fold. Muscular strength within  normal limits in all groups bilateral.   Assesement and Plan: Problem List Items Addressed This Visit    None    Visit Diagnoses    Ingrowing nail    -  Primary   Relevant Medications   neomycin-polymyxin-hydrocortisone (CORTISPORIN) OTIC solution   Toe pain, right          -Discussed treatment alternatives and plan of care; Explained permanent/temporary nail avulsion and post procedure course to patient. -Patient declined nail procedure  -Rx Corticosporin solution and recommend Epsom salt soaks  daily -Patient was instructed to monitor the toe for signs of infection and return to office if toe becomes red, hot or swollen. -Advised ice, elevation, and tylenol or motrin if needed for pain.  -Patient is to return if no better within 2 weeks for nail procedure or sooner if problems arise.  Asencion Islamitorya Yong Wahlquist, DPM

## 2018-04-12 ENCOUNTER — Ambulatory Visit (INDEPENDENT_AMBULATORY_CARE_PROVIDER_SITE_OTHER): Payer: BLUE CROSS/BLUE SHIELD | Admitting: Podiatry

## 2018-04-12 ENCOUNTER — Encounter: Payer: Self-pay | Admitting: Podiatry

## 2018-04-12 DIAGNOSIS — L600 Ingrowing nail: Secondary | ICD-10-CM

## 2018-04-12 NOTE — Patient Instructions (Addendum)

## 2018-04-14 NOTE — Progress Notes (Signed)
Subjective:  Patient ID: Sara Chambers, female    DOB: 1975-11-20,  MRN: [id number]  Chief Complaint  Patient presents with  . Nail Problem    i saw Dr Marylene Land and she stated that i would probably have to have this nail taken off     42 y.o. female presents with and ingrown toenail to the right great toe.  Has seen Dr. Marylene Land for this issue.  States that she is taking antibiotics but still thinks that the toe was ingrown and causing her pain. Past Medical History:  Diagnosis Date  . Allergy   . Anxiety   . Arthritis   . Barrett's esophagus   . Chronic headaches    migraine  . Depression   . GERD (gastroesophageal reflux disease)   . Hypertension   . IBS (irritable bowel syndrome)   . Obesity    Past Surgical History:  Procedure Laterality Date  . bilateral breast reduction    . CHOLECYSTECTOMY     1997  . COLONOSCOPY    . REDUCTION MAMMAPLASTY Bilateral   . UPPER GASTROINTESTINAL ENDOSCOPY  02-24-2014    Current Outpatient Medications:  .  ALPRAZolam (XANAX) 0.25 MG tablet, Take 0.25 mg by mouth 3 (three) times daily., Disp: , Rfl:  .  celecoxib (CELEBREX) 200 MG capsule, Take 200 mg by mouth daily., Disp: , Rfl:  .  cetirizine (ZYRTEC ALLERGY) 10 MG tablet, Take 10 mg by mouth daily., Disp: , Rfl:  .  cyclobenzaprine (FLEXERIL) 5 MG tablet, Take by mouth as needed., Disp: , Rfl:  .  DEXILANT 60 MG capsule, TAKE ONE CAPSULE BY MOUTH EVERY MORNING, Disp: 90 capsule, Rfl: 1 .  hydrOXYzine (ATARAX/VISTARIL) 10 MG tablet, Take 10 mg by mouth 3 (three) times daily as needed., Disp: , Rfl:  .  linaclotide (LINZESS) 72 MCG capsule, Take 1 capsule (72 mcg total) by mouth daily before breakfast., Disp: 30 capsule, Rfl: 3 .  metoCLOPramide (REGLAN) 5 MG tablet, TAKE ONE TABLET 30 MINUTES BEFORE DINNER, Disp: 90 tablet, Rfl: 1 .  neomycin-polymyxin-hydrocortisone (CORTISPORIN) OTIC solution, Apply to right great toe nail margin one to two times a day until pain and swelling at nail fold is  resolved, Disp: 10 mL, Rfl: 0 .  Probiotic Product (PROBIOTIC DAILY) CAPS, Take by mouth., Disp: , Rfl:  .  ranitidine (ZANTAC) 150 MG capsule, Take 150 mg by mouth 2 (two) times daily., Disp: , Rfl:  .  rizatriptan (MAXALT) 10 MG tablet, Take 10 mg by mouth as needed., Disp: , Rfl:  .  sucralfate (CARAFATE) 1 g tablet, Take 1 tablet (1 g total) by mouth 3 (three) times daily before meals., Disp: 90 tablet, Rfl: 0  Current Facility-Administered Medications:  .  0.9 %  sodium chloride infusion, 500 mL, Intravenous, Continuous, Meryl Dare, MD  Allergies  Allergen Reactions  . Biaxin [Clarithromycin] Nausea And Vomiting  . Citalopram Hydrobromide     Pt does not know reaction  . Codeine Nausea And Vomiting  . Penicillins Rash   Review of Systems Objective:  There were no vitals filed for this visit. General AA&O x3. Normal mood and affect.  Vascular Dorsalis pedis and posterior tibial pulses  present 2+ bilaterally  Capillary refill normal to all digits. Pedal hair growth normal.  Neurologic Epicritic sensation grossly present.  Dermatologic No open lesions. Interspaces clear of maceration. Nails well groomed and normal in appearance. Painful ingrowing nail at medial nail borders of the hallux nail right.  Orthopedic:  MMT 5/5 in dorsiflexion, plantarflexion, inversion, and eversion. Normal joint ROM without pain or crepitus. Pain to palpation about the ingrown nail.   Assessment & Plan:  Patient was evaluated and treated and all questions answered.  Ingrown Nail, right -Patient elects to proceed with ingrown toenail removal today -Ingrown nail excised. See procedure note. -Educated on post-procedure care including soaking. Written instructions provided. -Patient to follow up in 2 weeks for nail check.  Procedure: Excision of Ingrown Toenail Location: Right 1st toe medial nail borders. Anesthesia: Lidocaine 1% plain; 1.5 mL and Marcaine 0.5% plain; 1.5 mL, digital  block. Skin Prep: Betadine. Dressing: Silvadene; telfa; dry, sterile, compression dressing. Technique: Following skin prep, the toe was exsanguinated and a tourniquet was secured at the base of the toe. The affected nail border was freed, split with a nail splitter, and excised. Chemical matrixectomy was then performed with phenol and irrigated out with alcohol. The tourniquet was then removed and sterile dressing applied. Disposition: Patient tolerated procedure well. Patient to return in 2 weeks for follow-up.   No follow-ups on file.

## 2018-04-16 ENCOUNTER — Telehealth: Payer: Self-pay | Admitting: Podiatry

## 2018-04-16 NOTE — Telephone Encounter (Signed)
I spoke with pt and she states the toe is more sore than it had been the day after and even yesterday. I told pt that it was not unusual at this time in the post op process, to continue the soaking with the epsom salt and neosporin ointment after, the area could be tender for 2-4 weeks depending on activity and shoe gear, report as she did any increase redness, swelling, pain and discharge. Pt states understanding.

## 2018-04-16 NOTE — Telephone Encounter (Signed)
Dr. Samuella CotaPrice removed an ingrown toenail on Friday 19 July. Yesterday and today I'm noticing it is a lot more sore, its not necessarily any redder but I didn't know if he needed to call in antibiotics. If someone can just give me a call back at [phone] or my work number [phone]. If you're going to call in a medicine you can leave a message on either one of those numbers if I do not answer. Thank you.

## 2018-05-16 ENCOUNTER — Other Ambulatory Visit: Payer: Self-pay | Admitting: Nurse Practitioner

## 2018-05-23 ENCOUNTER — Other Ambulatory Visit: Payer: Self-pay | Admitting: Gastroenterology

## 2018-05-29 ENCOUNTER — Other Ambulatory Visit: Payer: Self-pay

## 2018-05-29 ENCOUNTER — Telehealth: Payer: Self-pay | Admitting: Nurse Practitioner

## 2018-05-29 MED ORDER — DEXLANSOPRAZOLE 60 MG PO CPDR: 1.0000 | DELAYED_RELEASE_CAPSULE | Freq: Every morning | ORAL | 1 refills | Status: DC

## 2018-05-29 MED ORDER — METOCLOPRAMIDE HCL 5 MG PO TABS: ORAL_TABLET | ORAL | 1 refills | Status: DC

## 2018-05-29 NOTE — Telephone Encounter (Signed)
Prescriptions sent electronically for Reglan and Dexilant.

## 2018-06-06 DIAGNOSIS — Z1389 Encounter for screening for other disorder: Secondary | ICD-10-CM | POA: Diagnosis not present

## 2018-06-06 DIAGNOSIS — F4320 Adjustment disorder, unspecified: Secondary | ICD-10-CM | POA: Diagnosis not present

## 2018-06-06 DIAGNOSIS — E6609 Other obesity due to excess calories: Secondary | ICD-10-CM | POA: Diagnosis not present

## 2018-06-06 DIAGNOSIS — Z6835 Body mass index (BMI) 35.0-35.9, adult: Secondary | ICD-10-CM | POA: Diagnosis not present

## 2018-06-11 ENCOUNTER — Ambulatory Visit (INDEPENDENT_AMBULATORY_CARE_PROVIDER_SITE_OTHER): Payer: BLUE CROSS/BLUE SHIELD | Admitting: Physician Assistant

## 2018-06-11 ENCOUNTER — Encounter: Payer: Self-pay | Admitting: Physician Assistant

## 2018-06-11 VITALS — BP 124/82 | HR 89 | Ht 68.0 in | Wt 230.3 lb

## 2018-06-11 DIAGNOSIS — K2270 Barrett's esophagus without dysplasia: Secondary | ICD-10-CM | POA: Diagnosis not present

## 2018-06-11 DIAGNOSIS — K581 Irritable bowel syndrome with constipation: Secondary | ICD-10-CM

## 2018-06-11 DIAGNOSIS — K219 Gastro-esophageal reflux disease without esophagitis: Secondary | ICD-10-CM | POA: Diagnosis not present

## 2018-06-11 MED ORDER — RANITIDINE HCL 150 MG PO CAPS
150.0000 mg | ORAL_CAPSULE | Freq: Two times a day (BID) | ORAL | 4 refills | Status: DC
Start: 2018-06-11 — End: 2018-09-20

## 2018-06-11 MED ORDER — METOCLOPRAMIDE HCL 5 MG PO TABS: ORAL_TABLET | ORAL | 4 refills | Status: DC

## 2018-06-11 MED ORDER — DEXLANSOPRAZOLE 60 MG PO CPDR: 1.0000 | DELAYED_RELEASE_CAPSULE | Freq: Every morning | ORAL | 4 refills | Status: DC

## 2018-06-11 NOTE — Patient Instructions (Signed)
If you are age 8965 or older, your body mass index should be between 23-30. Your Body mass index is 35.02 kg/m. If this is out of the aforementioned range listed, please consider follow up with your Primary Care Provider.  If you are age 44 or younger, your body mass index should be between 19-25. Your Body mass index is 35.02 kg/m. If this is out of the aformentioned range listed, please consider follow up with your Primary Care Provider.   We have sent the following medications to your pharmacy for you to pick up at your convenience: Reglan Dexilant Zantac  Follow up in one year or sooner if needed.  Thank you for choosing me and [facility].   Amy Esterwood, PA-C

## 2018-06-11 NOTE — Progress Notes (Signed)
Reviewed and agree with management plan.  Mickie Kozikowski T. Analleli Gierke, MD FACG 

## 2018-06-11 NOTE — Progress Notes (Signed)
Subjective:    Patient ID: Sara Chambers, female    DOB: 06/20/1976, 42 y.o.   MRN: [id number]  HPI Lawson FiscalLori is a pleasant [age] year old white female, known to Dr. Russella DarStark and last seen in our office in June 2018.  She has history of GERD and long segment Barrett's with last EGD August 2017 showing Barrett's without dysplasia.  She had colonoscopy and September 2016 which was a normal exam. She also has history of IBS and mild constipation. She comes in today basically for medication refills.  She has been on Dexilant 60 mg p.o. every morning which is working well and in addition takes Reglan 5 mg before meals dinner.  She has also been on Zantac on her and 50 mg p.o. twice daily. Very occasionally will have some breakthrough symptoms but not on any regular basis.  She denies any dysphagia or odynophagia.  No complaints of abdominal pain.  She has mild constipation but generally manages that with stool softeners.  She had been given a prescription of Linzess last year but never took it.  Review of Systems Pertinent positive and negative review of systems were noted in the above HPI section.  All other review of systems was otherwise negative.  Outpatient Encounter Medications as of 06/11/2018  Medication Sig  . buPROPion (WELLBUTRIN) 100 MG tablet Take 100 mg by mouth daily.  . cetirizine (ZYRTEC ALLERGY) 10 MG tablet Take 10 mg by mouth daily.  . cyclobenzaprine (FLEXERIL) 5 MG tablet Take by mouth as needed.  Marland Kitchen. dexlansoprazole (DEXILANT) 60 MG capsule Take 1 capsule (60 mg total) by mouth every morning.  . metoCLOPramide (REGLAN) 5 MG tablet Take on tablet 30 minutes before dinner  . Probiotic Product (PROBIOTIC DAILY) CAPS Take by mouth.  . ranitidine (ZANTAC) 150 MG capsule Take 1 capsule (150 mg total) by mouth 2 (two) times daily.  . rizatriptan (MAXALT) 10 MG tablet Take 10 mg by mouth as needed.  . [DISCONTINUED] dexlansoprazole (DEXILANT) 60 MG capsule Take 1 capsule (60 mg total) by mouth every  morning.  . [DISCONTINUED] metoCLOPramide (REGLAN) 5 MG tablet Take on tablet 30 minutes before dinner  . [DISCONTINUED] ranitidine (ZANTAC) 150 MG capsule Take 150 mg by mouth 2 (two) times daily.  . [DISCONTINUED] ALPRAZolam (XANAX) 0.25 MG tablet Take 0.25 mg by mouth 3 (three) times daily.  . [DISCONTINUED] celecoxib (CELEBREX) 200 MG capsule Take 200 mg by mouth daily.  . [DISCONTINUED] hydrOXYzine (ATARAX/VISTARIL) 10 MG tablet Take 10 mg by mouth 3 (three) times daily as needed.  . [DISCONTINUED] linaclotide (LINZESS) 72 MCG capsule Take 1 capsule (72 mcg total) by mouth daily before breakfast.  . [DISCONTINUED] neomycin-polymyxin-hydrocortisone (CORTISPORIN) OTIC solution Apply to right great toe nail margin one to two times a day until pain and swelling at nail fold is resolved  . [DISCONTINUED] sucralfate (CARAFATE) 1 g tablet Take 1 tablet (1 g total) by mouth 3 (three) times daily before meals.   Facility-Administered Encounter Medications as of 06/11/2018  Medication  . 0.9 %  sodium chloride infusion   Allergies  Allergen Reactions  . Biaxin [Clarithromycin] Nausea And Vomiting  . Citalopram Hydrobromide     Pt does not know reaction  . Codeine Nausea And Vomiting  . Penicillins Rash   Patient Active Problem List   Diagnosis Date Noted  . Abnormal ultrasound 04/29/2015  . Elevated LFTs 04/29/2015  . Upper abdominal pain 04/29/2015  . Abnormal findings on radiological examination of gastrointestinal tract 04/29/2015  .  Arthritis of knee, degenerative 09/01/2014  . Barrett esophagus 03/09/2014  . Adaptive colitis 06/28/2012  . Cephalalgia 06/10/2010  . Cannot sleep 06/10/2010   Social History   Socioeconomic History  . Marital status: Married    Spouse name: Not on file  . Number of children: 2  . Years of education: Not on file  . Highest education level: Not on file  Occupational History  . Occupation: replenishment specialist  Social Needs  . Financial  resource strain: Not on file  . Food insecurity:    Worry: Not on file    Inability: Not on file  . Transportation needs:    Medical: Not on file    Non-medical: Not on file  Tobacco Use  . Smoking status: Never Smoker  . Smokeless tobacco: Never Used  Substance and Sexual Activity  . Alcohol use: Yes    Alcohol/week: 0.0 standard drinks    Comment: once per year  . Drug use: Yes    Types: Opium  . Sexual activity: Yes  Lifestyle  . Physical activity:    Days per week: Not on file    Minutes per session: Not on file  . Stress: Not on file  Relationships  . Social connections:    Talks on phone: Not on file    Gets together: Not on file    Attends religious service: Not on file    Active member of club or organization: Not on file    Attends meetings of clubs or organizations: Not on file    Relationship status: Not on file  . Intimate partner violence:    Fear of current or ex partner: Not on file    Emotionally abused: Not on file    Physically abused: Not on file    Forced sexual activity: Not on file  Other Topics Concern  . Not on file  Social History Narrative  . Not on file    Ms. Lucarelli's family history includes Colon cancer in her unknown relative; Colon polyps in her father and mother; Diabetes in her maternal grandfather; Heart disease in her maternal grandfather and paternal grandfather; Rectal cancer in her maternal uncle.      Objective:    Vitals:   06/11/18 1003  BP: 124/82  Pulse: 89    Physical Exam ; well-developed white female in no acute distress, very pleasant blood pressure 124/82 pulse 88, BMI 35.0.  HEENT nontraumatic normocephalic EOMI PERRLA sclera anicteric, Extremities no clubbing cyanosis or edema skin warm dry, cap neuro psych alert and oriented, grossly nonfocal mood and affect appropriate.  Not further examined today       Assessment & Plan:   #16 42 year old white female with chronic GERD, somewhat refractory, here for  follow-up and med refills.  She is doing well with her current regimen #2 Long segment Barrett's esophagus, no previous dysplasia- due for repeat EGD August 2020  #3 IBS/constipation predominant-mild-normal colonoscopy September 2016  Plan; will refill Dexilant 60 mg p.o. every morning, 90-day supply with 1 year refills Continue Zantac on her 50 mg p.o. twice daily/1 year refills Continue metoclopramide 5 mg p.o. AC dinner in the refill x1 year We discussed follow-up EGD for surveillance of Barrett's which will be due in August 2020.  I have asked her to make follow-up appointment next summer. Continue antireflux regimen She can follow-up with Dr. Russella Dar or myself on an as-needed basis.  Amy Oswald Hillock PA-C 06/11/2018   Cc: Kathlee Nations, MD

## 2018-07-13 DIAGNOSIS — J0101 Acute recurrent maxillary sinusitis: Secondary | ICD-10-CM | POA: Diagnosis not present

## 2018-07-13 DIAGNOSIS — Z6834 Body mass index (BMI) 34.0-34.9, adult: Secondary | ICD-10-CM | POA: Diagnosis not present

## 2018-08-05 DIAGNOSIS — L820 Inflamed seborrheic keratosis: Secondary | ICD-10-CM | POA: Diagnosis not present

## 2018-08-05 DIAGNOSIS — D485 Neoplasm of uncertain behavior of skin: Secondary | ICD-10-CM | POA: Diagnosis not present

## 2018-09-20 ENCOUNTER — Other Ambulatory Visit: Payer: Self-pay | Admitting: Physician Assistant

## 2018-09-20 MED ORDER — FAMOTIDINE 40 MG PO TABS: 40.0000 mg | ORAL_TABLET | Freq: Every day | ORAL | 3 refills | Status: DC

## 2018-09-20 NOTE — Telephone Encounter (Signed)
rx switched to famotidine 40 mg daily due to recall.

## 2018-11-08 DIAGNOSIS — Z6835 Body mass index (BMI) 35.0-35.9, adult: Secondary | ICD-10-CM | POA: Diagnosis not present

## 2018-11-08 DIAGNOSIS — Z1389 Encounter for screening for other disorder: Secondary | ICD-10-CM | POA: Diagnosis not present

## 2018-11-08 DIAGNOSIS — E6609 Other obesity due to excess calories: Secondary | ICD-10-CM | POA: Diagnosis not present

## 2018-11-08 DIAGNOSIS — J3089 Other allergic rhinitis: Secondary | ICD-10-CM | POA: Diagnosis not present

## 2018-11-11 ENCOUNTER — Ambulatory Visit: Payer: BLUE CROSS/BLUE SHIELD | Admitting: Podiatrist

## 2018-11-11 DIAGNOSIS — L600 Ingrowing nail: Secondary | ICD-10-CM

## 2018-11-11 NOTE — Progress Notes (Signed)
Patient presents today for concern over a painful medial nail border of the right hallux nail which was previously removed on 04/12/2018 by Dr. Samuella Cota.  She states it gets red and swollen and is painful along the medial nail border.  She denies any drainage no systemic signs of infection reported.    Objective neurovascular status is intact with palpable pedal pulses and neurological sensation intact right.  The medial nail border has a very small spicule present which is painful with palpation.    Assessment residual nail spicule- medial side right first   Plan: I did my best to remove the nail spicule today without anesthesia.  Gave instructions for soaking the toe and allowing the remainder of the toenail to grow out along with the nail portion that is uncomfortable.  If it continues to be bothersome she will return and we will take a little more of the medial nail border off for her.  If she notices any redness swelling or sign of infection she will call immediately.

## 2018-11-12 ENCOUNTER — Encounter: Payer: Self-pay | Admitting: Podiatrist

## 2018-12-02 IMAGING — MG 2D DIGITAL DIAGNOSTIC BILATERAL MAMMOGRAM WITH CAD AND ADJUNCT T
8 of 13 series · 8 of 29 positions shown · non-contrast
Comparison: 08/13/2015 and earlier

CLINICAL DATA: Delayed 2 year follow-up for asymmetry in the right
breast.

EXAM:
2D DIGITAL DIAGNOSTIC BILATERAL MAMMOGRAM WITH CAD AND ADJUNCT TOMO

[L MLO (1 of 2)]
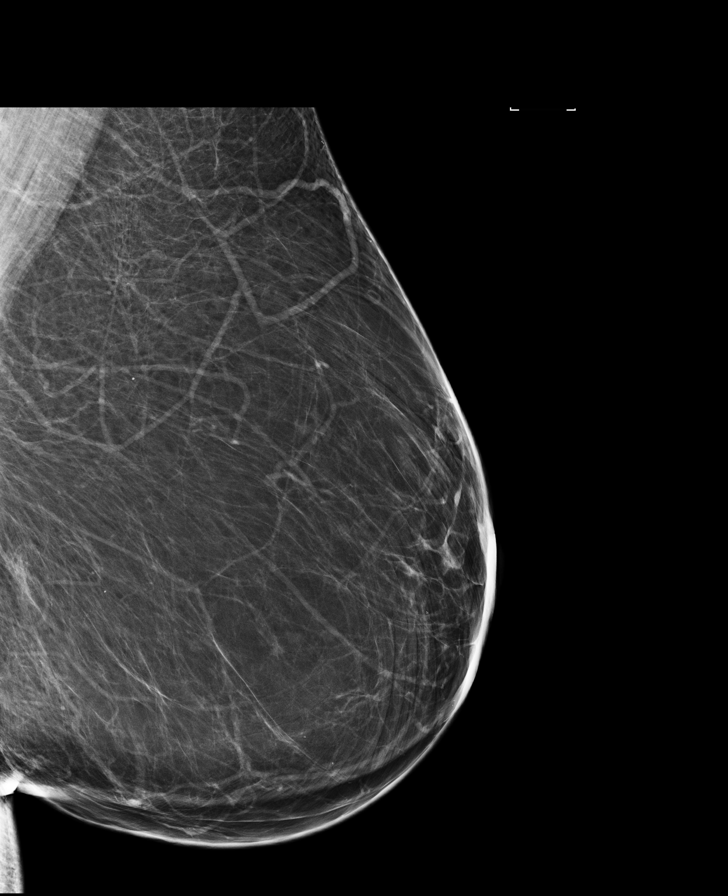

[L CC]
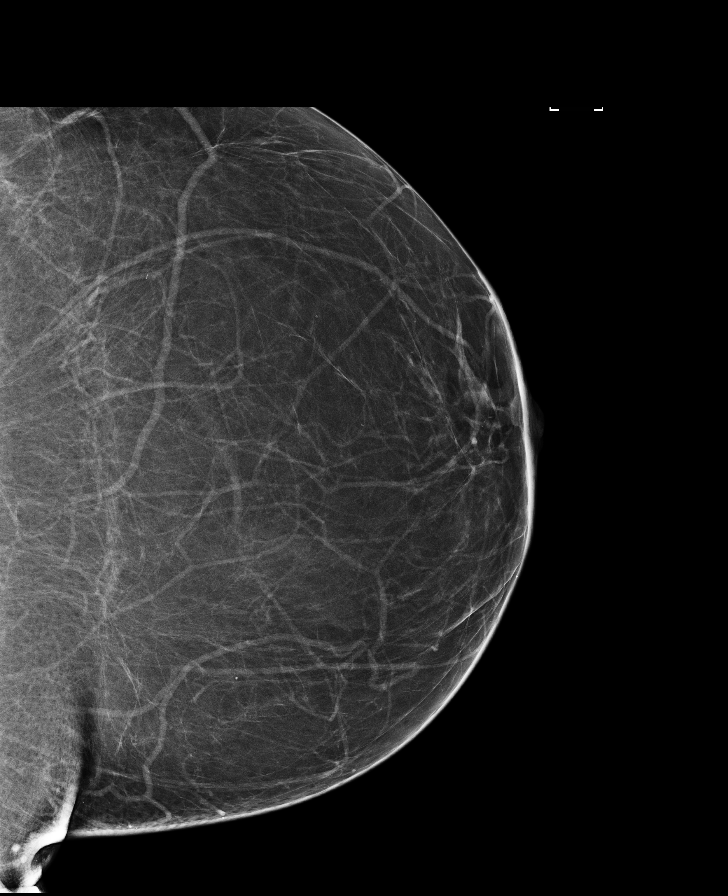

[R MLO synth-2D]
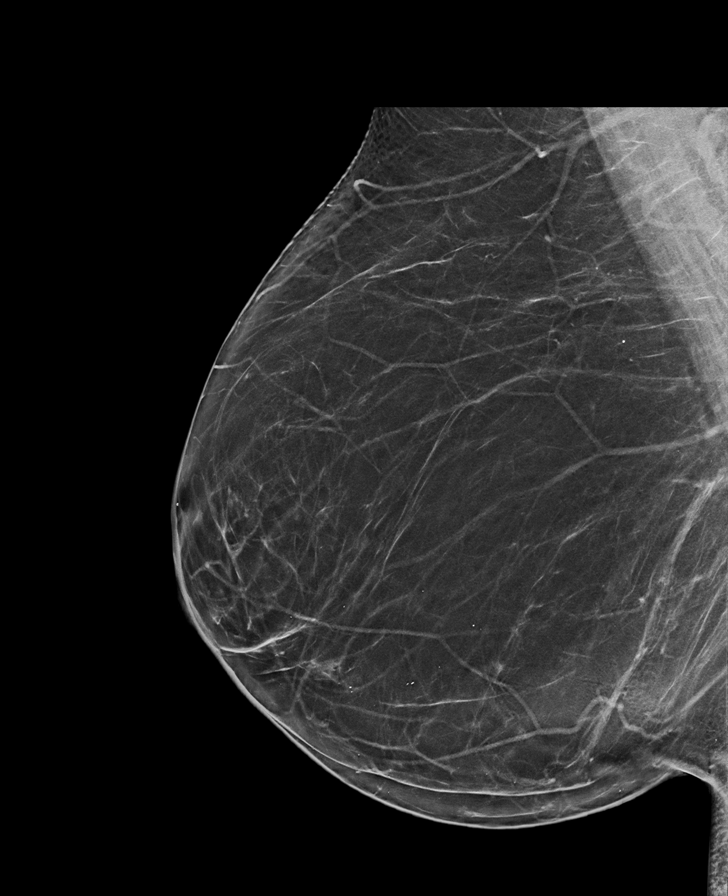

[L MLO (2 of 2)]
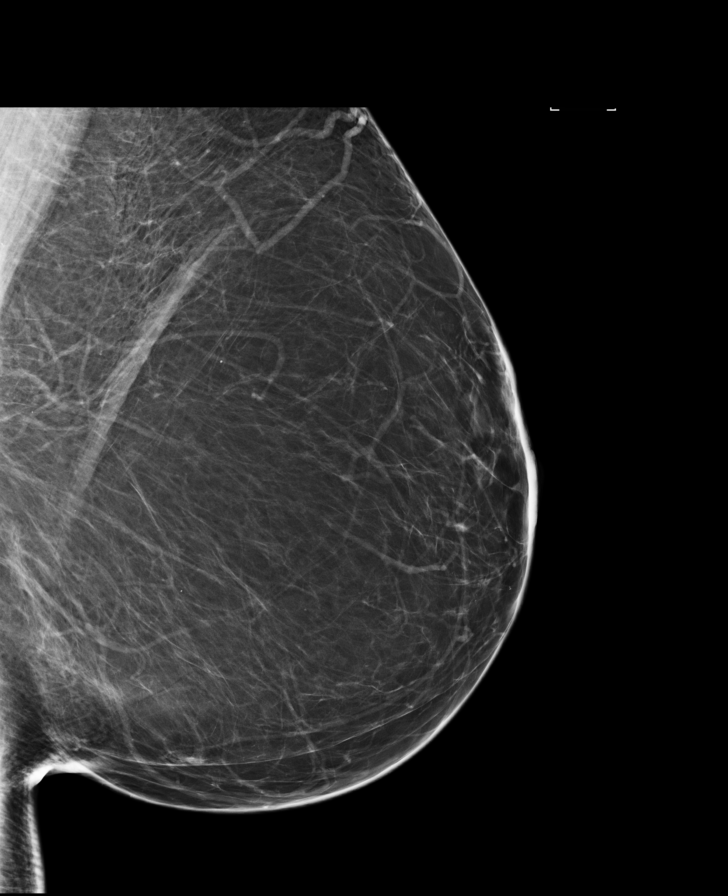

[L MLO synth-2D]
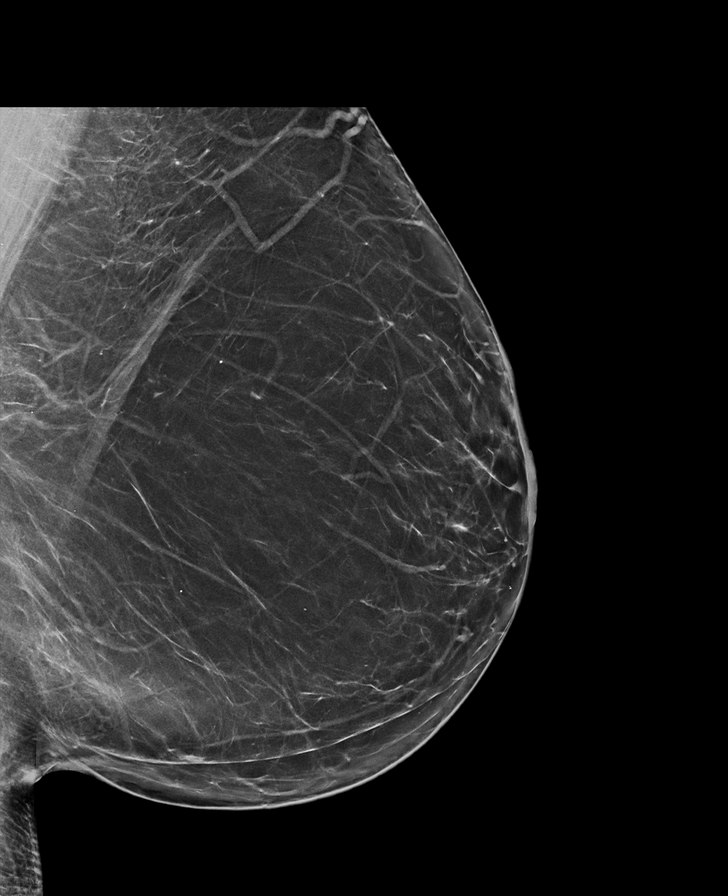

[R CC synth-2D]
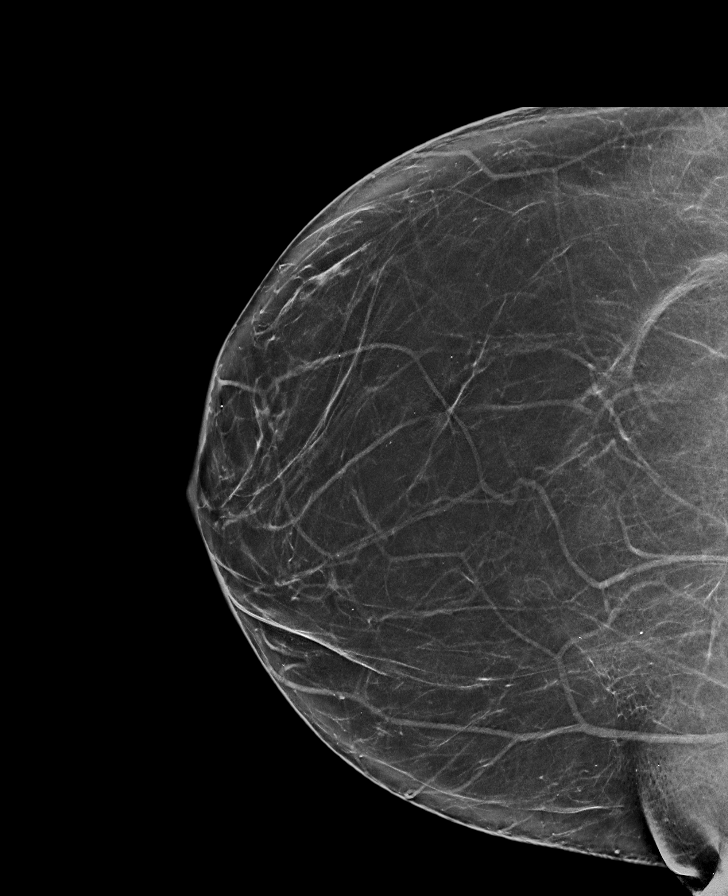

[L CC synth-2D]
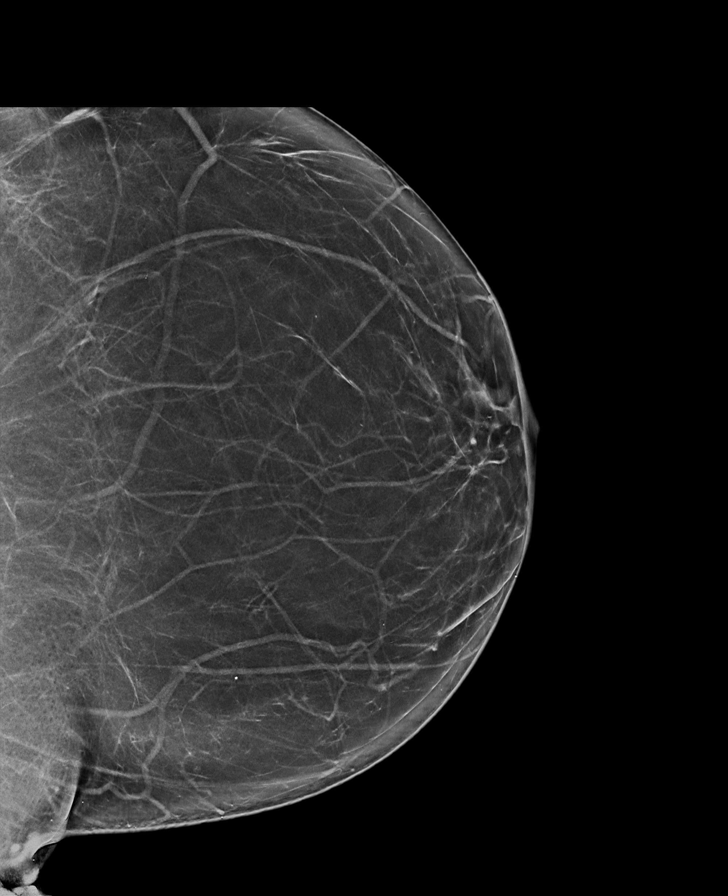

[R CC]
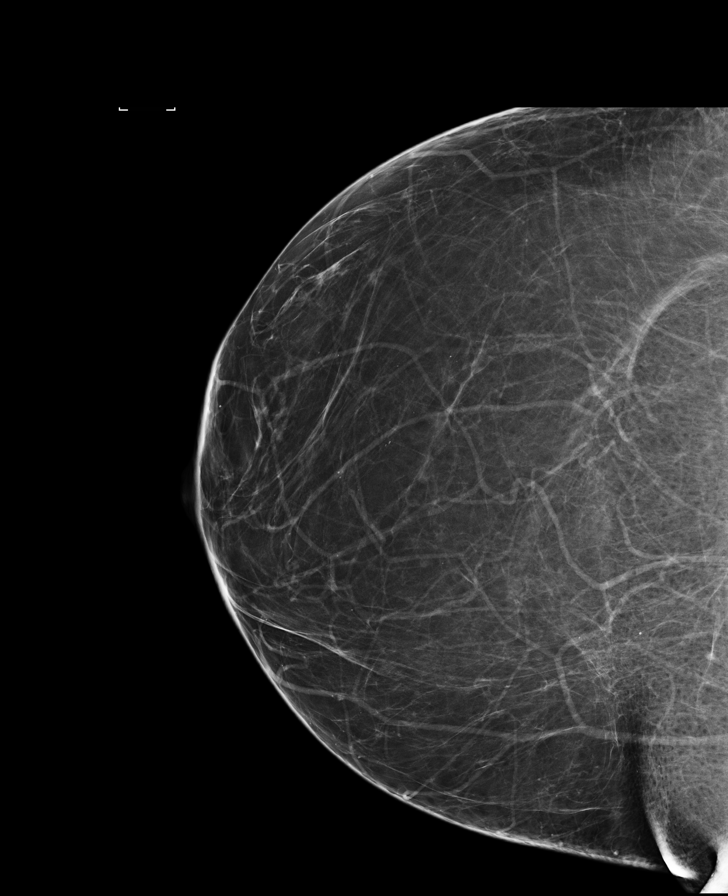

[8 of 29 positions shown; findings below may reference images not displayed]

ACR Breast Density Category b: There are scattered areas of
fibroglandular density.
FINDINGS: No suspicious mass, distortion, or microcalcifications are
identified to suggest presence of malignancy. There has been
interval resolution the asymmetry in the medial portion of the right
breast.

Mammographic images were processed with CAD.
IMPRESSION: No mammographic evidence for malignancy.

RECOMMENDATION:
Screening mammogram in one year.(Code:9Y-[id number])

I have discussed the findings and recommendations with the patient.
Results were also provided in writing at the conclusion of the
visit. If applicable, a reminder letter will be sent to the patient
regarding the next appointment.

BI-RADS CATEGORY  1: Negative.

## 2019-02-21 ENCOUNTER — Other Ambulatory Visit: Payer: Self-pay

## 2019-02-21 ENCOUNTER — Ambulatory Visit: Payer: BLUE CROSS/BLUE SHIELD | Admitting: Podiatry

## 2019-02-21 ENCOUNTER — Encounter: Payer: Self-pay | Admitting: Podiatry

## 2019-02-21 VITALS — Temp 97.2°F

## 2019-02-21 DIAGNOSIS — L600 Ingrowing nail: Secondary | ICD-10-CM | POA: Diagnosis not present

## 2019-02-21 MED ORDER — NEOMYCIN-POLYMYXIN-HC 3.5-10000-1 OT SOLN: OTIC | 0 refills | Status: DC

## 2019-02-21 NOTE — Patient Instructions (Signed)

## 2019-02-21 NOTE — Progress Notes (Signed)
Subjective:  Patient ID: Sara Chambers, female    DOB: 02/20/1976,  MRN: [id number]  Chief Complaint  Patient presents with  . Toe Pain    Follow up hallux right - medial border   "I still have a problem with this area, still swells"    43 y.o. female presents with the above complaint. Still having a spicule at the inside border of the right toe. Dr. Irving ShowsEgerton attempted to remove it last time but would like it removed permanently today.  Review of Systems: Negative except as noted in the HPI. Denies N/V/F/Ch.  Past Medical History:  Diagnosis Date  . Allergy   . Anxiety   . Arthritis   . Barrett's esophagus   . Chronic headaches    migraine  . Depression   . GERD (gastroesophageal reflux disease)   . Hypertension   . IBS (irritable bowel syndrome)   . Obesity     Current Outpatient Medications:  .  acyclovir (ZOVIRAX) 800 MG tablet, Take by mouth., Disp: , Rfl:  .  cetirizine (ZYRTEC ALLERGY) 10 MG tablet, Take 10 mg by mouth daily., Disp: , Rfl:  .  dexlansoprazole (DEXILANT) 60 MG capsule, Take 1 capsule (60 mg total) by mouth every morning., Disp: 90 capsule, Rfl: 4 .  metoCLOPramide (REGLAN) 5 MG tablet, Take on tablet 30 minutes before dinner, Disp: 90 tablet, Rfl: 4 .  neomycin-polymyxin-hydrocortisone (CORTISPORIN) OTIC solution, Apply 2 drops to the ingrown toenail site twice daily. Cover with band-aid., Disp: 10 mL, Rfl: 0 .  Probiotic Product (PROBIOTIC DAILY) CAPS, Take by mouth., Disp: , Rfl:  .  ranitidine (ZANTAC) 150 MG tablet, Please specify directions, refills and quantity, Disp: 1 tablet, Rfl: 0 .  rizatriptan (MAXALT) 10 MG tablet, Take 10 mg by mouth as needed., Disp: , Rfl:  .  SINUS 12 HOUR 120 MG 12 hr tablet, Take 120 mg by mouth every 12 (twelve) hours., Disp: , Rfl:   Current Facility-Administered Medications:  .  0.9 %  sodium chloride infusion, 500 mL, Intravenous, Continuous, Meryl DareStark, Malcolm T, MD  Social History   Tobacco Use  Smoking Status Never  Smoker  Smokeless Tobacco Never Used    Allergies  Allergen Reactions  . Biaxin [Clarithromycin] Nausea And Vomiting  . Citalopram Hydrobromide     Pt does not know reaction  . Codeine Nausea And Vomiting  . Penicillins Rash   Objective:   Vitals:   02/21/19 0751  Temp: (!) 97.2 F (36.2 C)   There is no height or weight on file to calculate BMI. Constitutional Well developed. Well nourished.  Vascular Dorsalis pedis pulses palpable bilaterally. Posterior tibial pulses palpable bilaterally. Capillary refill normal to all digits.  No cyanosis or clubbing noted. Pedal hair growth normal.  Neurologic Normal speech. Oriented to person, place, and time. Epicritic sensation to light touch grossly present bilaterally.  Dermatologic Painful ingrowing nail spicule at medial nail borders of the hallux nail right. No other open wounds. No skin lesions.  Orthopedic: Normal joint ROM without pain or crepitus bilaterally. No visible deformities. No bony tenderness.   Radiographs: None Assessment:   1. Ingrowing nail   2. Ingrown right big toenail    Plan:  Patient was evaluated and treated and all questions answered.  Ingrown Nail, right -Patient elects to proceed with minor surgery to remove ingrown toenail removal today. Consent reviewed and signed by patient. -Ingrown nail excised. See procedure note. -Educated on post-procedure care including soaking. Written instructions provided and  reviewed. -Patient to follow up in 2 weeks for nail check.  Procedure: Excision of Ingrown Toenail Location: Right 1st toe medial nail borders. Anesthesia: Lidocaine 1% plain; 1.5 mL and Marcaine 0.5% plain; 1.5 mL, digital block. Skin Prep: Betadine. Dressing: Silvadene; telfa; dry, sterile, compression dressing. Technique: Following skin prep, the toe was exsanguinated and a tourniquet was secured at the base of the toe. The affected nail border was freed, split with a nail splitter, and  excised. Chemical matrixectomy was then performed with phenol and irrigated out with alcohol. The tourniquet was then removed and sterile dressing applied. Disposition: Patient tolerated procedure well. Patient to return in 2 weeks for follow-up.   Return in about 2 weeks (around 03/07/2019) for Nail Check.

## 2019-02-24 ENCOUNTER — Telehealth: Payer: Self-pay | Admitting: Podiatry

## 2019-02-24 MED ORDER — SILVER SULFADIAZINE 1 % EX CREA: 1.0000 "application " | TOPICAL_CREAM | Freq: Every day | CUTANEOUS | 0 refills | Status: DC

## 2019-02-24 NOTE — Telephone Encounter (Signed)
Pt called stating that she had ingrown toenail removed on Friday and was burnt by one of the chemicals used in the procedure. The pt started that the burnt area has blistered up and would like the doctor to call in a cream for her to use on the toe. Please give patient a call.

## 2019-02-24 NOTE — Telephone Encounter (Signed)
Dr. Samuella Cota ordered lidocaine cream alternate with neosporin. I informed pt she could use the neosporin with lidocaine because Lidocaine cream may not be pre-certed in time for her to use today. Pt refused states she knows there is a cream she can use for the burn and pain caused by the numbing spray. I offered pt an appt and she states she does not have the time. I told her I would send Dr. Samuella Cota a picture of the area if she would send.

## 2019-02-24 NOTE — Telephone Encounter (Signed)
I informed Sara Chambers of Dr. Kandice Hams statement and orders and told her since she had purchased the drops she could continue the drops on the nail and the cream on the toe.

## 2019-02-24 NOTE — Addendum Note (Signed)
Addended by: Alphia Kava D on: 02/24/2019 02:01 PM   Modules accepted: Orders

## 2019-02-24 NOTE — Telephone Encounter (Signed)
It doesn't look too bad it should resolve in a few days we can send some silvadene to apply assuming no sulfa allergy. She can apply to the nail site too

## 2019-03-04 ENCOUNTER — Other Ambulatory Visit: Payer: Self-pay | Admitting: Nurse Practitioner

## 2019-03-07 ENCOUNTER — Ambulatory Visit: Payer: BLUE CROSS/BLUE SHIELD | Admitting: Podiatry

## 2019-03-07 ENCOUNTER — Other Ambulatory Visit: Payer: Self-pay

## 2019-03-07 VITALS — Temp 97.9°F

## 2019-03-07 DIAGNOSIS — L600 Ingrowing nail: Secondary | ICD-10-CM | POA: Diagnosis not present

## 2019-03-07 DIAGNOSIS — M79674 Pain in right toe(s): Secondary | ICD-10-CM | POA: Diagnosis not present

## 2019-03-07 NOTE — Progress Notes (Signed)
  Subjective:  Patient ID: Sara Chambers, female    DOB: [birth year],  MRN: [id number]  Chief Complaint  Patient presents with  . Nail Problem    Right 1st medial border follow up. Pt states still a little sore, no drainage, would like to discuss if she still needs to continue drop medication.   43 y.o. female returns for the above complaint.  History above confirmed with patient  Objective:   General AA&O x3. Normal mood and affect.  Vascular Foot warm and well perfused with good capillary refill.  Neurologic Sensation grossly intact.  Dermatologic Nail avulsion site healing well without drainage or erythema. No local warmth. Nail bed with overlying soft crust. Small residual redness over the dorsal surface of the toe due to ethyl chloride. No signs of local infection.  Orthopedic: No tenderness to palpation of the toe.   Assessment & Plan:  Patient was evaluated and treated and all questions answered.  S/p Ingrown Toenail Excision, right -Healing well without issue. -Ethyl chloride frostburn resolving. -Discussed return precautions. -F/u PRN

## 2019-03-24 ENCOUNTER — Telehealth: Payer: Self-pay | Admitting: Gastroenterology

## 2019-03-24 MED ORDER — METOCLOPRAMIDE HCL 5 MG PO TABS: ORAL_TABLET | ORAL | 0 refills | Status: DC

## 2019-03-24 NOTE — Telephone Encounter (Signed)
Pt states rx filled by mail order however it was lost in mail.   She has been going back and forth with them.  They had told her to go to pharmacy and they will refill however they will not.  She is wanting a refill for Reglan.  Call in to [facility] [address].

## 2019-03-24 NOTE — Telephone Encounter (Signed)
Patient states she needs a 30 day supply of Reglan sent to her local [facility] since the [facility] lost her prescription. Prescription sent.

## 2019-04-04 ENCOUNTER — Ambulatory Visit: Payer: BC Managed Care – PPO | Admitting: Podiatry

## 2019-06-10 ENCOUNTER — Telehealth: Payer: Self-pay | Admitting: Gastroenterology

## 2019-06-10 MED ORDER — DEXILANT 60 MG PO CPDR: 1.0000 | DELAYED_RELEASE_CAPSULE | Freq: Every morning | ORAL | 0 refills | Status: DC

## 2019-06-10 NOTE — Telephone Encounter (Signed)
Pt states that her mail order for Dexilant was lost by the post office, they are sending it again but she only has one pill left so would like to know if she can get some samples in the meantime. Pls call her.

## 2019-06-10 NOTE — Telephone Encounter (Signed)
Informed patient that we received samples of Dexilant and she can pick them up at the front desk. Patient verbalized understanding.

## 2019-06-10 NOTE — Telephone Encounter (Signed)
Informed patient we do not normally carry Dexilant samples in the office but are expecting to get some in within the next couple of days. Did mention for patient to take something otc until she gets her medication in the mail or we get samples. Patient states she would like me to send in a temporary 2 week prescription to her pharmacy and she will try to pay out of pocket. Patient states she would also like me to contact her when we receive samples also. Prescription sent.

## 2019-06-11 DIAGNOSIS — M25774 Osteophyte, right foot: Secondary | ICD-10-CM | POA: Diagnosis not present

## 2019-06-11 DIAGNOSIS — M79674 Pain in right toe(s): Secondary | ICD-10-CM | POA: Diagnosis not present

## 2019-06-13 ENCOUNTER — Other Ambulatory Visit: Payer: Self-pay | Admitting: Physician Assistant

## 2019-07-18 DIAGNOSIS — Z0000 Encounter for general adult medical examination without abnormal findings: Secondary | ICD-10-CM | POA: Diagnosis not present

## 2019-07-24 DIAGNOSIS — Z01419 Encounter for gynecological examination (general) (routine) without abnormal findings: Secondary | ICD-10-CM | POA: Diagnosis not present

## 2019-07-24 DIAGNOSIS — Z1231 Encounter for screening mammogram for malignant neoplasm of breast: Secondary | ICD-10-CM | POA: Diagnosis not present

## 2019-07-24 DIAGNOSIS — Z6836 Body mass index (BMI) 36.0-36.9, adult: Secondary | ICD-10-CM | POA: Diagnosis not present

## 2019-07-28 ENCOUNTER — Other Ambulatory Visit: Payer: Self-pay | Admitting: Obstetrics & Gynecology

## 2019-07-28 DIAGNOSIS — Z13228 Encounter for screening for other metabolic disorders: Secondary | ICD-10-CM | POA: Diagnosis not present

## 2019-07-28 DIAGNOSIS — Z13220 Encounter for screening for lipoid disorders: Secondary | ICD-10-CM | POA: Diagnosis not present

## 2019-07-28 DIAGNOSIS — R928 Other abnormal and inconclusive findings on diagnostic imaging of breast: Secondary | ICD-10-CM

## 2019-07-28 DIAGNOSIS — R635 Abnormal weight gain: Secondary | ICD-10-CM | POA: Diagnosis not present

## 2019-07-30 ENCOUNTER — Other Ambulatory Visit: Payer: Self-pay

## 2019-07-30 ENCOUNTER — Ambulatory Visit
Admission: RE | Admit: 2019-07-30 | Discharge: 2019-07-30 | Disposition: A | Discharge status: Home / Self Care | Payer: BLUE CROSS/BLUE SHIELD | Source: Ambulatory Visit | Attending: Obstetrics & Gynecology | Admitting: Obstetrics & Gynecology

## 2019-07-30 DIAGNOSIS — R928 Other abnormal and inconclusive findings on diagnostic imaging of breast: Secondary | ICD-10-CM

## 2019-07-30 DIAGNOSIS — N6489 Other specified disorders of breast: Secondary | ICD-10-CM | POA: Diagnosis not present

## 2019-08-19 DIAGNOSIS — L608 Other nail disorders: Secondary | ICD-10-CM | POA: Diagnosis not present

## 2019-08-19 DIAGNOSIS — L600 Ingrowing nail: Secondary | ICD-10-CM | POA: Diagnosis not present

## 2019-09-03 ENCOUNTER — Other Ambulatory Visit: Payer: Self-pay | Admitting: Physician Assistant

## 2019-10-14 ENCOUNTER — Telehealth: Payer: Self-pay | Admitting: Gastroenterology

## 2019-10-14 MED ORDER — DEXILANT 60 MG PO CPDR: 1.0000 | DELAYED_RELEASE_CAPSULE | Freq: Every morning | ORAL | 0 refills | Status: DC

## 2019-10-14 MED ORDER — METOCLOPRAMIDE HCL 5 MG PO TABS: ORAL_TABLET | ORAL | 0 refills | Status: DC

## 2019-10-14 NOTE — Telephone Encounter (Signed)
Patient is calling- requesting refills for dexilant and reglan. She states that she is overdue to come in for the endoscopy but does not feel comfortable having that done right now and wants to wait until covid calms down. She is asking if she needs to come in to see the doctor to get refills she is fine with that just does not want to do procedure.

## 2019-10-14 NOTE — Telephone Encounter (Signed)
Prescriptions sent to patient's pharmacy and patient notified.  

## 2019-10-14 NOTE — Telephone Encounter (Signed)
Patient states she has been working from home and would like to schedule a virtual visit follow up with Dr. Russella Dar. Patient is coming in for yearly f/u and is not having any symptoms. Patient scheduled for Doximity for 11/11/19 at 2:30pm. Prescriptions sent to patients pharmacy.

## 2019-10-20 DIAGNOSIS — J0100 Acute maxillary sinusitis, unspecified: Secondary | ICD-10-CM | POA: Diagnosis not present

## 2019-10-24 DIAGNOSIS — J302 Other seasonal allergic rhinitis: Secondary | ICD-10-CM | POA: Diagnosis not present

## 2019-10-24 DIAGNOSIS — J3489 Other specified disorders of nose and nasal sinuses: Secondary | ICD-10-CM | POA: Diagnosis not present

## 2019-10-24 DIAGNOSIS — R509 Fever, unspecified: Secondary | ICD-10-CM | POA: Diagnosis not present

## 2019-10-24 DIAGNOSIS — R0982 Postnasal drip: Secondary | ICD-10-CM | POA: Diagnosis not present

## 2019-11-11 ENCOUNTER — Ambulatory Visit (INDEPENDENT_AMBULATORY_CARE_PROVIDER_SITE_OTHER): Payer: BC Managed Care – PPO | Admitting: Gastroenterology

## 2019-11-11 ENCOUNTER — Encounter: Payer: Self-pay | Admitting: Gastroenterology

## 2019-11-11 VITALS — Ht 68.0 in | Wt 241.0 lb

## 2019-11-11 DIAGNOSIS — Z1212 Encounter for screening for malignant neoplasm of rectum: Secondary | ICD-10-CM | POA: Diagnosis not present

## 2019-11-11 DIAGNOSIS — K581 Irritable bowel syndrome with constipation: Secondary | ICD-10-CM

## 2019-11-11 DIAGNOSIS — K2270 Barrett's esophagus without dysplasia: Secondary | ICD-10-CM

## 2019-11-11 DIAGNOSIS — Z1211 Encounter for screening for malignant neoplasm of colon: Secondary | ICD-10-CM | POA: Diagnosis not present

## 2019-11-11 DIAGNOSIS — Z7689 Persons encountering health services in other specified circumstances: Secondary | ICD-10-CM | POA: Diagnosis not present

## 2019-11-11 DIAGNOSIS — J302 Other seasonal allergic rhinitis: Secondary | ICD-10-CM | POA: Diagnosis not present

## 2019-11-11 MED ORDER — METOCLOPRAMIDE HCL 5 MG PO TABS: ORAL_TABLET | ORAL | 3 refills | Status: AC

## 2019-11-11 MED ORDER — TRULANCE 3 MG PO TABS: 1.0000 | ORAL_TABLET | Freq: Every day | ORAL | 0 refills | Status: DC

## 2019-11-11 MED ORDER — DEXILANT 60 MG PO CPDR: 1.0000 | DELAYED_RELEASE_CAPSULE | Freq: Every morning | ORAL | 3 refills | Status: DC

## 2019-11-11 NOTE — Progress Notes (Signed)
    History of Present Illness: This is a 44 year old female with long segment Barrett's esophagus without dysplasia.  She relates her reflux symptoms have been under very good control on her current regimen.  She has delayed to her surveillance endoscopy which was due in August 2020 due to her concerns about the COVID-19 pandemic.  She has had worsening problems with her IBS with constipation.  She can go up to 2 weeks between bowel movements.  At one point Linzess was recommended. She read the side effect profile, was concerned about the high incidence of diarrhea and after further review she requests to try Trulance. Denies weight loss, abdominal pain, diarrhea, change in stool caliber, melena, hematochezia, nausea, vomiting, dysphagia, chest pain.   Current Medications, Allergies, Past Medical History, Past Surgical History, Family History and Social History were reviewed in [employer] record.   Physical Exam:Limited with virtual visit  General: Well developed, well nourished, no acute distress Head: Normocephalic and atraumatic Eyes:  sclerae anicteric, EOMI Ears: Normal auditory acuity Psychological:  Alert and cooperative. Normal mood and affect   Assessment and Recommendations:  1. GERD with long segment Barrett's without dysplasia.  She is a few months overdue for her 3-year interval surveillance.  I encouraged her to schedule endoscopy at this time, reviewed our COVID-19 related protocols and high vaccination status with [facility] staff with pre-procedure COVID-19 screening for all patients who are not fully vaccinated.  She remains concerned about scheduling anything outside of her home that is not absolutely urgent so she declines to schedule.  Enter EGD recall for August 2021 and she will reconsider at that time.  Refill Dexilant 60 mg every morning with 90-day supply.  Refill metoclopramide 5 mg every afternoon with a 90-day supply. Follow antireflux measures. REV in  1 year.   2. IBS-C.  Begin Trulance 3 mg daily with a 30-day supply, no refills.  She will call us to provide a progress report in 2 to 3 weeks.  3.  CRC screening, average risk.  Current recommendations are to begin screening at age 82.  Change colonoscopy recall to August 2021.   These services were provided via telemedicine, audio and video.  The patient was at home and the provider was in the office, alone.  We discussed the limitations of evaluation and management by telemedicine and the availability of in person appointments.  Patient consented for this telemedicine visit and is aware of possible charges for this service.  Office CMA or LPN participated in this telemedicine service.  Time spent on call: 12 minutes

## 2019-11-11 NOTE — Patient Instructions (Addendum)
If you are age 46 or older, your body mass index should be between 23-30. Your Body mass index is 36.64 kg/m. If this is out of the aforementioned range listed, please consider follow up with your Primary Care Provider.  If you are age 40 or younger, your body mass index should be between 19-25. Your Body mass index is 36.64 kg/m. If this is out of the aformentioned range listed, please consider follow up with your Primary Care Provider.   We have sent the following medications to your pharmacy for you to pick up at your convenience: START Trulance 3 mg 1 tablet daily.  Please contact the office in 2-3 weeks to let us know how Trulance is working for you.  We have sent the following prescriptions to your mail in pharmacy: Dexilant and Reglan  If you have not heard from your mail in pharmacy within 1 week or if you have not received your medication in the mail, please contact us at [phone] so we may find out why.   You will be due for a recall Endoscopy in August. We will send you a reminder in the mail when it gets closer to that time.  Please contact the office to schedule your 1 year follow up.

## 2019-11-12 ENCOUNTER — Telehealth: Payer: Self-pay | Admitting: Gastroenterology

## 2019-11-12 NOTE — Telephone Encounter (Signed)
Received fax from [employer] stating that the member belongs to a health insurance that [employer] does not review. Initiated another PA through cover my meds with [employer].

## 2019-11-12 NOTE — Telephone Encounter (Signed)
Called [facility] pharmacy to find out a good phone number to reach out to the insurance company to initiate a PA. [facility] states they have a main number to contact for [employer]. # [phone] Initiated PA on cover my meds. Called patient to inform her we are waiting to hear from insurance company with the decision.

## 2019-11-17 NOTE — Telephone Encounter (Signed)
Informed patient that PA was approved on cover my meds but to call office with any questions.

## 2019-12-09 DIAGNOSIS — Z79899 Other long term (current) drug therapy: Secondary | ICD-10-CM | POA: Diagnosis not present

## 2019-12-09 DIAGNOSIS — E669 Obesity, unspecified: Secondary | ICD-10-CM | POA: Diagnosis not present

## 2019-12-10 ENCOUNTER — Telehealth: Payer: Self-pay | Admitting: Gastroenterology

## 2019-12-10 MED ORDER — TRULANCE 3 MG PO TABS: 1.0000 | ORAL_TABLET | Freq: Every day | ORAL | 2 refills | Status: AC

## 2019-12-10 NOTE — Telephone Encounter (Signed)
Patient is requesting refill 90 day supply on Plecanatide to be [facility] to [facility]

## 2019-12-10 NOTE — Telephone Encounter (Signed)
Prescription sent to patient's pharmacy. Patient notified.  

## 2020-01-13 ENCOUNTER — Other Ambulatory Visit: Payer: Self-pay | Admitting: Gastroenterology

## 2020-01-22 ENCOUNTER — Telehealth: Payer: Self-pay | Admitting: Gastroenterology

## 2020-02-02 DIAGNOSIS — Z72820 Sleep deprivation: Secondary | ICD-10-CM | POA: Diagnosis not present

## 2020-02-02 DIAGNOSIS — R5383 Other fatigue: Secondary | ICD-10-CM | POA: Diagnosis not present

## 2020-02-02 DIAGNOSIS — R0683 Snoring: Secondary | ICD-10-CM | POA: Diagnosis not present

## 2020-02-13 DIAGNOSIS — G4733 Obstructive sleep apnea (adult) (pediatric): Secondary | ICD-10-CM | POA: Diagnosis not present

## 2020-03-09 DIAGNOSIS — G4733 Obstructive sleep apnea (adult) (pediatric): Secondary | ICD-10-CM | POA: Diagnosis not present

## 2020-03-09 DIAGNOSIS — G4719 Other hypersomnia: Secondary | ICD-10-CM | POA: Diagnosis not present

## 2020-03-12 DIAGNOSIS — G4733 Obstructive sleep apnea (adult) (pediatric): Secondary | ICD-10-CM | POA: Diagnosis not present

## 2020-03-12 DIAGNOSIS — G4719 Other hypersomnia: Secondary | ICD-10-CM | POA: Diagnosis not present

## 2020-04-11 DIAGNOSIS — G4733 Obstructive sleep apnea (adult) (pediatric): Secondary | ICD-10-CM | POA: Diagnosis not present

## 2020-04-11 DIAGNOSIS — G4719 Other hypersomnia: Secondary | ICD-10-CM | POA: Diagnosis not present

## 2020-04-13 DIAGNOSIS — M5430 Sciatica, unspecified side: Secondary | ICD-10-CM | POA: Diagnosis not present

## 2020-04-20 DIAGNOSIS — Z713 Dietary counseling and surveillance: Secondary | ICD-10-CM | POA: Diagnosis not present

## 2020-04-30 DIAGNOSIS — R0981 Nasal congestion: Secondary | ICD-10-CM | POA: Diagnosis not present

## 2020-04-30 DIAGNOSIS — G4733 Obstructive sleep apnea (adult) (pediatric): Secondary | ICD-10-CM | POA: Diagnosis not present

## 2020-04-30 DIAGNOSIS — Z9989 Dependence on other enabling machines and devices: Secondary | ICD-10-CM | POA: Diagnosis not present

## 2020-05-12 DIAGNOSIS — G4719 Other hypersomnia: Secondary | ICD-10-CM | POA: Diagnosis not present

## 2020-05-12 DIAGNOSIS — G4733 Obstructive sleep apnea (adult) (pediatric): Secondary | ICD-10-CM | POA: Diagnosis not present

## 2020-05-20 DIAGNOSIS — Z713 Dietary counseling and surveillance: Secondary | ICD-10-CM | POA: Diagnosis not present

## 2020-06-04 DIAGNOSIS — M25552 Pain in left hip: Secondary | ICD-10-CM | POA: Diagnosis not present

## 2020-06-04 DIAGNOSIS — M25551 Pain in right hip: Secondary | ICD-10-CM | POA: Diagnosis not present

## 2020-06-12 DIAGNOSIS — G4733 Obstructive sleep apnea (adult) (pediatric): Secondary | ICD-10-CM | POA: Diagnosis not present

## 2020-06-12 DIAGNOSIS — G4719 Other hypersomnia: Secondary | ICD-10-CM | POA: Diagnosis not present

## 2020-06-15 DIAGNOSIS — Z713 Dietary counseling and surveillance: Secondary | ICD-10-CM | POA: Diagnosis not present

## 2020-06-29 DIAGNOSIS — M7062 Trochanteric bursitis, left hip: Secondary | ICD-10-CM | POA: Diagnosis not present

## 2020-06-29 DIAGNOSIS — M7602 Gluteal tendinitis, left hip: Secondary | ICD-10-CM | POA: Diagnosis not present

## 2020-06-29 DIAGNOSIS — M25552 Pain in left hip: Secondary | ICD-10-CM | POA: Diagnosis not present

## 2020-06-29 DIAGNOSIS — M7918 Myalgia, other site: Secondary | ICD-10-CM | POA: Diagnosis not present

## 2020-07-05 DIAGNOSIS — M25652 Stiffness of left hip, not elsewhere classified: Secondary | ICD-10-CM | POA: Diagnosis not present

## 2020-07-05 DIAGNOSIS — M25552 Pain in left hip: Secondary | ICD-10-CM | POA: Diagnosis not present

## 2020-07-05 DIAGNOSIS — M7062 Trochanteric bursitis, left hip: Secondary | ICD-10-CM | POA: Diagnosis not present

## 2020-07-05 DIAGNOSIS — R29898 Other symptoms and signs involving the musculoskeletal system: Secondary | ICD-10-CM | POA: Diagnosis not present

## 2020-07-05 DIAGNOSIS — M6281 Muscle weakness (generalized): Secondary | ICD-10-CM | POA: Diagnosis not present

## 2020-07-06 ENCOUNTER — Other Ambulatory Visit: Payer: Self-pay | Admitting: Gastroenterology

## 2020-08-13 DIAGNOSIS — H6693 Otitis media, unspecified, bilateral: Secondary | ICD-10-CM | POA: Diagnosis not present

## 2020-08-13 DIAGNOSIS — J0100 Acute maxillary sinusitis, unspecified: Secondary | ICD-10-CM | POA: Diagnosis not present

## 2020-08-18 DIAGNOSIS — J019 Acute sinusitis, unspecified: Secondary | ICD-10-CM | POA: Diagnosis not present

## 2020-08-18 DIAGNOSIS — G43909 Migraine, unspecified, not intractable, without status migrainosus: Secondary | ICD-10-CM | POA: Diagnosis not present

## 2020-08-26 DIAGNOSIS — J302 Other seasonal allergic rhinitis: Secondary | ICD-10-CM | POA: Diagnosis not present

## 2020-08-26 DIAGNOSIS — G43009 Migraine without aura, not intractable, without status migrainosus: Secondary | ICD-10-CM | POA: Diagnosis not present

## 2020-08-26 DIAGNOSIS — J3489 Other specified disorders of nose and nasal sinuses: Secondary | ICD-10-CM | POA: Diagnosis not present

## 2020-09-21 DIAGNOSIS — Z1231 Encounter for screening mammogram for malignant neoplasm of breast: Secondary | ICD-10-CM | POA: Diagnosis not present

## 2020-09-21 DIAGNOSIS — Z6841 Body Mass Index (BMI) 40.0 and over, adult: Secondary | ICD-10-CM | POA: Diagnosis not present

## 2020-09-21 DIAGNOSIS — Z01419 Encounter for gynecological examination (general) (routine) without abnormal findings: Secondary | ICD-10-CM | POA: Diagnosis not present

## 2020-09-23 DIAGNOSIS — Z13220 Encounter for screening for lipoid disorders: Secondary | ICD-10-CM | POA: Diagnosis not present

## 2020-09-23 DIAGNOSIS — Z0000 Encounter for general adult medical examination without abnormal findings: Secondary | ICD-10-CM | POA: Diagnosis not present

## 2020-09-23 DIAGNOSIS — F330 Major depressive disorder, recurrent, mild: Secondary | ICD-10-CM | POA: Diagnosis not present
